# Patient Record
Sex: Female | Born: 1970 | Race: White | Hispanic: No | Marital: Married | Smoking: Never smoker
Health system: Southern US, Community
[De-identification: ages and names within clinical notes are randomized; demographics above are authoritative.]

## PROBLEM LIST (undated history)

## (undated) DIAGNOSIS — N809 Endometriosis, unspecified: Secondary | ICD-10-CM

## (undated) DIAGNOSIS — G4700 Insomnia, unspecified: Secondary | ICD-10-CM

## (undated) DIAGNOSIS — I83813 Varicose veins of bilateral lower extremities with pain: Secondary | ICD-10-CM

## (undated) DIAGNOSIS — I6522 Occlusion and stenosis of left carotid artery: Secondary | ICD-10-CM

## (undated) DIAGNOSIS — I4510 Unspecified right bundle-branch block: Secondary | ICD-10-CM

## (undated) DIAGNOSIS — J302 Other seasonal allergic rhinitis: Secondary | ICD-10-CM

## (undated) DIAGNOSIS — G510 Bell's palsy: Secondary | ICD-10-CM

## (undated) HISTORY — PX: WISDOM TOOTH EXTRACTION: SHX21

## (undated) HISTORY — DX: Endometriosis, unspecified: N80.9

## (undated) HISTORY — DX: Occlusion and stenosis of left carotid artery: I65.22

## (undated) HISTORY — DX: Bell's palsy: G51.0

## (undated) HISTORY — DX: Morbid (severe) obesity due to excess calories: E66.01

## (undated) HISTORY — DX: Insomnia, unspecified: G47.00

## (undated) HISTORY — DX: Unspecified right bundle-branch block: I45.10

---

## 2007-12-25 ENCOUNTER — Other Ambulatory Visit: Admission: RE | Admit: 2007-12-25 | Discharge: 2007-12-25 | Payer: Self-pay | Admitting: Family Medicine

## 2011-02-12 ENCOUNTER — Other Ambulatory Visit (HOSPITAL_COMMUNITY)
Admission: RE | Admit: 2011-02-12 | Discharge: 2011-02-12 | Disposition: A | Discharge status: Home / Self Care | Payer: BC Managed Care – PPO | Source: Ambulatory Visit | Attending: Family Medicine | Admitting: Family Medicine

## 2011-02-12 DIAGNOSIS — Z124 Encounter for screening for malignant neoplasm of cervix: Secondary | ICD-10-CM | POA: Insufficient documentation

## 2013-02-19 ENCOUNTER — Other Ambulatory Visit (HOSPITAL_COMMUNITY): Payer: Self-pay | Admitting: Family Medicine

## 2013-02-19 DIAGNOSIS — Z1231 Encounter for screening mammogram for malignant neoplasm of breast: Secondary | ICD-10-CM

## 2013-02-23 ENCOUNTER — Ambulatory Visit (HOSPITAL_COMMUNITY)
Admission: RE | Admit: 2013-02-23 | Discharge: 2013-02-23 | Disposition: A | Discharge status: Home / Self Care | Payer: BC Managed Care – PPO | Source: Ambulatory Visit | Attending: Family Medicine | Admitting: Family Medicine

## 2013-02-23 DIAGNOSIS — Z1231 Encounter for screening mammogram for malignant neoplasm of breast: Secondary | ICD-10-CM

## 2013-05-19 ENCOUNTER — Other Ambulatory Visit: Payer: Self-pay | Admitting: Family Medicine

## 2013-05-19 ENCOUNTER — Other Ambulatory Visit (HOSPITAL_COMMUNITY)
Admission: RE | Admit: 2013-05-19 | Discharge: 2013-05-19 | Disposition: A | Discharge status: Home / Self Care | Payer: BC Managed Care – PPO | Source: Ambulatory Visit | Attending: Family Medicine | Admitting: Family Medicine

## 2013-05-19 DIAGNOSIS — Z1151 Encounter for screening for human papillomavirus (HPV): Secondary | ICD-10-CM | POA: Insufficient documentation

## 2013-05-19 DIAGNOSIS — Z124 Encounter for screening for malignant neoplasm of cervix: Secondary | ICD-10-CM | POA: Insufficient documentation

## 2014-06-01 ENCOUNTER — Other Ambulatory Visit: Payer: Self-pay | Admitting: Obstetrics & Gynecology

## 2014-06-30 ENCOUNTER — Encounter (HOSPITAL_COMMUNITY): Payer: Self-pay | Admitting: *Deleted

## 2014-07-19 NOTE — H&P (Signed)
44yo G0 who presents for operative laparoscopy, left oophorectomy, bilateral salpingectomy and hysteroscopy, HTA ablation due to ovarian cyst and abnormal uterine bleeding. In review, the patient reports AUB with heavy periods with at least 7 days of bleeding, 2-3 days of heavy bleeding using 10-12 tampons per day and sometimes changing her tampon every 2 hours. + passage of clots/discharge. +Dysmenorrhea- improved with motrin. During her menses, she reports fatigue and "feeling drained." Hgb within normal limits- no headache, dizziness or heart palpitations. Work up included an EMB (negative for hyperplasia or malignancy) as well as a TVUS. The US performed on 3/9 showed an anteverted uterus measuring 9.7x5.9x4.7cm with a posterior submucosal fibroid measuring 2.3x2.1x1.9cm. right ovary unremarkable, left ovary with complex ovarian cyst measuring 4x3.7x3.2cm, avascular. Upon discussion of her cyst, she does report significantly more pain on her left than her right during her menses. Reviewed risk and benefit of conservative vs surgical management of cyst and patient has elected to proceed with surgical management.       ROS:  CONSTITUTIONAL:  no Chills. no Fever. no Skin rash.  HEENT:  Blurrred vision no.  CARDIOLOGY:  no Chest pain.  RESPIRATORY:  no Shortness of breath. no Cough.  GASTROENTEROLOGY:  no Appetite change. no Change in bowel movements.  UROLOGY:  no Urinary frequency. no Urinary incontinence. no Urinary urgency.  FEMALE REPRODUCTIVE:  no Breast lumps or discharge. no Breast pain. no Unusual vaginal discharge. no Vaginal irritation. no Vaginal itching.  NEUROLOGY:  no Dizziness. no Headache.         Medical History: Seasonal allergies, Occasional insomnia, obesity       Gyn History:  Sexual activity currently sexually active.  Periods : every month.  LMP 3/23 (about to start her next menses) Birth control Vasectomy.  Last pap smear date 05/2013.  Last mammogram date  02/23/2013.        OB History:  Never been pregnant per patient.        Surgical History: colposcopy for abn pap smear--normal results. 01/07.        Hospitalization/Major Diagnostic Procedure: see surg hx .        Family History: Father: alive 13 yrs, poor circulation, no contact with fatherMother: alive 60 yrs, rare heart defect, autoimmune issuesBrother 1: alive 72 yrs, osteoporosis       Social History:  General:  Tobacco use  cigarettes: Never smoked Tobacco history last updated 05/20/2014 no Smoking.  Alcohol: yes, beer or wine 1 x daily.  Caffeine: yes, coffee 2 x daily.  no Recreational drug use, no.  Diet: Weight Watchers.  Exercise: yes, 1-2 times per week, walks, bikes, eliptical.  Occupation: employed, Scientist, research (life sciences) estate.  Education: [employer].  Marital Status: married, Married.  Children: none, No.  Firearms: no.  Seat belt use: always.        Medications: Taking Ibuprofen 200 MG Tablet 2 tablets as needed, Taking Claritin(Loratadine) 10 MG Tablet 1 tablet Once a day, Taking Sudafed(Pseudoephedrine HCl) 30 MG Tablet 1 tablet as needed every 6 hrs, Taking Benadryl(DiphenhydrAMINE HCl) 25 mg Capsule 1 capsule as needed       Allergies: N.K.D.A.      Objective: performed on 4/14     Vitals: Wt 187, Wt change -5 lb, Ht 65.75, BMI 30.41, Temp 98.0, Pulse sitting 69, BP sitting 124/73.       Examination:  General Examination:  GENERAL APPEARANCE alert, oriented, NAD, pleasant.  SKIN: normal, no rash.  LUNGS: clear to auscultation bilaterally, no wheezes,  rhonchi, rales.  HEART: no murmurs, regular rate and rhythm.  ABDOMEN: no masses palpated, soft and not tender, no rebound, no guarding.  FEMALE GENITOURINARY: No external lesions, Vagina - pink moist mucosa, no lesions or abnormal discharge, cervix - closed, no discharge or lesions. No CMT. No adnexal masses bilaterally. Uterus: nontender and normal size on palpation.  EXTREMITIES: no edema present, no calf  tenderness bilaterally.    A/P: 44yo G0 who presents for operative laparoscopy, left oophorectomy, bilateral salpingectomy and hysteroscopy, HTA ablation due to ovarian cyst and abnormal uterine bleeding. -NPO -LR @ 125cc/hr -CBC, BMP, T&S to be collected -Ancef 2g IV to OR -Risks, benefits and indications reviewed with patient including but not limited to risk of bleeding, infection, injury to surrounding organs ans possible need for further surgery.  Pt aware and wishes to proceed.  Janyth Pupa, DO [phone] (pager) [phone] (office)

## 2014-07-20 ENCOUNTER — Ambulatory Visit (HOSPITAL_COMMUNITY): Payer: BLUE CROSS/BLUE SHIELD | Admitting: Registered Nurse

## 2014-07-20 ENCOUNTER — Encounter (HOSPITAL_COMMUNITY)
Admission: RE | Disposition: A | Discharge status: Home / Self Care | Payer: Self-pay | Source: Ambulatory Visit | Attending: Obstetrics & Gynecology

## 2014-07-20 ENCOUNTER — Encounter (HOSPITAL_COMMUNITY): Payer: Self-pay

## 2014-07-20 ENCOUNTER — Ambulatory Visit (HOSPITAL_COMMUNITY)
Admission: RE | Admit: 2014-07-20 | Discharge: 2014-07-20 | Disposition: A | Discharge status: Home / Self Care | Payer: BLUE CROSS/BLUE SHIELD | Source: Ambulatory Visit | Attending: Obstetrics & Gynecology | Admitting: Obstetrics & Gynecology

## 2014-07-20 DIAGNOSIS — N830 Follicular cyst of ovary: Secondary | ICD-10-CM | POA: Insufficient documentation

## 2014-07-20 DIAGNOSIS — N802 Endometriosis of fallopian tube: Secondary | ICD-10-CM | POA: Diagnosis not present

## 2014-07-20 DIAGNOSIS — N8320 Unspecified ovarian cysts: Secondary | ICD-10-CM | POA: Diagnosis present

## 2014-07-20 DIAGNOSIS — N736 Female pelvic peritoneal adhesions (postinfective): Secondary | ICD-10-CM | POA: Diagnosis not present

## 2014-07-20 DIAGNOSIS — N939 Abnormal uterine and vaginal bleeding, unspecified: Secondary | ICD-10-CM | POA: Insufficient documentation

## 2014-07-20 DIAGNOSIS — N801 Endometriosis of ovary: Secondary | ICD-10-CM | POA: Diagnosis not present

## 2014-07-20 DIAGNOSIS — N803 Endometriosis of pelvic peritoneum: Secondary | ICD-10-CM | POA: Insufficient documentation

## 2014-07-20 HISTORY — PX: HYSTEROSCOPY: SHX211

## 2014-07-20 HISTORY — PX: OOPHORECTOMY: SHX6387

## 2014-07-20 HISTORY — PX: DILATION AND CURETTAGE OF UTERUS: SHX78

## 2014-07-20 HISTORY — PX: BILATERAL SALPINGECTOMY: SHX5743

## 2014-07-20 HISTORY — PX: LAPAROSCOPY: SHX197

## 2014-07-20 LAB — CBC
HCT: 37.6 % (ref 36.0–46.0)
HEMOGLOBIN: 12.8 g/dL (ref 12.0–15.0)
MCH: 32 pg (ref 26.0–34.0)
MCHC: 34 g/dL (ref 30.0–36.0)
MCV: 94 fL (ref 78.0–100.0)
Platelets: 197 10*3/uL (ref 150–400)
RBC: 4 MIL/uL (ref 3.87–5.11)
RDW: 13.5 % (ref 11.5–15.5)
WBC: 6.2 10*3/uL (ref 4.0–10.5)

## 2014-07-20 LAB — TYPE AND SCREEN
ABO/RH(D): A POS
Antibody Screen: NEGATIVE

## 2014-07-20 LAB — ABO/RH: ABO/RH(D): A POS

## 2014-07-20 LAB — BASIC METABOLIC PANEL
Anion gap: 6 (ref 5–15)
BUN: 15 mg/dL (ref 6–23)
CHLORIDE: 106 mmol/L (ref 96–112)
CO2: 26 mmol/L (ref 19–32)
CREATININE: 0.74 mg/dL (ref 0.50–1.10)
Calcium: 8.9 mg/dL (ref 8.4–10.5)
GFR calc Af Amer: >90 mL/min (ref >90)
GFR calc non Af Amer: >90 mL/min (ref >90)
Glucose, Bld: 90 mg/dL (ref 70–99)
Potassium: 4 mmol/L (ref 3.5–5.1)
Sodium: 138 mmol/L (ref 135–145)

## 2014-07-20 SURGERY — LAPAROSCOPY OPERATIVE
Anesthesia: General | Site: Vagina

## 2014-07-20 MED ORDER — NEOSTIGMINE METHYLSULFATE 10 MG/10ML IV SOLN
INTRAVENOUS | Status: DC | PRN
  Administered 2014-07-20: 4 mg via INTRAVENOUS

## 2014-07-20 MED ORDER — GLYCOPYRROLATE 0.2 MG/ML IJ SOLN
INTRAMUSCULAR | Status: AC
  Filled 2014-07-20: qty 1

## 2014-07-20 MED ORDER — LACTATED RINGERS IV SOLN: INTRAVENOUS | Status: DC

## 2014-07-20 MED ORDER — DEXAMETHASONE SODIUM PHOSPHATE 4 MG/ML IJ SOLN
INTRAMUSCULAR | Status: DC | PRN
  Administered 2014-07-20: 4 mg via INTRAVENOUS

## 2014-07-20 MED ORDER — ONDANSETRON HCL 4 MG/2ML IJ SOLN: 4.0000 mg | Freq: Once | INTRAMUSCULAR | Status: DC | PRN

## 2014-07-20 MED ORDER — METOCLOPRAMIDE HCL 5 MG/ML IJ SOLN
INTRAMUSCULAR | Status: AC
  Administered 2014-07-20: 10 mg via INTRAVENOUS
  Filled 2014-07-20: qty 2

## 2014-07-20 MED ORDER — FENTANYL CITRATE (PF) 100 MCG/2ML IJ SOLN
INTRAMUSCULAR | Status: DC | PRN
  Administered 2014-07-20: 25 ug via INTRAVENOUS
  Administered 2014-07-20: 50 ug via INTRAVENOUS
  Administered 2014-07-20 (×2): 25 ug via INTRAVENOUS
  Administered 2014-07-20: 50 ug via INTRAVENOUS
  Administered 2014-07-20: 150 ug via INTRAVENOUS
  Administered 2014-07-20: 25 ug via INTRAVENOUS

## 2014-07-20 MED ORDER — CEFAZOLIN SODIUM-DEXTROSE 2-3 GM-% IV SOLR
2.0000 g | INTRAVENOUS | Status: AC
  Administered 2014-07-20: 2 g via INTRAVENOUS

## 2014-07-20 MED ORDER — DOCUSATE SODIUM 100 MG PO CAPS: 100.0000 mg | ORAL_CAPSULE | Freq: Two times a day (BID) | ORAL | Status: DC | PRN

## 2014-07-20 MED ORDER — PROPOFOL 10 MG/ML IV BOLUS
INTRAVENOUS | Status: DC | PRN
  Administered 2014-07-20: 200 mg via INTRAVENOUS

## 2014-07-20 MED ORDER — SCOPOLAMINE 1 MG/3DAYS TD PT72
1.0000 | MEDICATED_PATCH | Freq: Once | TRANSDERMAL | Status: DC
  Administered 2014-07-20: 1.5 mg via TRANSDERMAL

## 2014-07-20 MED ORDER — ONDANSETRON HCL 4 MG/2ML IJ SOLN
INTRAMUSCULAR | Status: DC | PRN
  Administered 2014-07-20: 4 mg via INTRAVENOUS

## 2014-07-20 MED ORDER — BUPIVACAINE HCL (PF) 0.25 % IJ SOLN
INTRAMUSCULAR | Status: DC | PRN
  Administered 2014-07-20: 28 mL

## 2014-07-20 MED ORDER — ROCURONIUM BROMIDE 100 MG/10ML IV SOLN
INTRAVENOUS | Status: DC | PRN
  Administered 2014-07-20: 40 mg via INTRAVENOUS

## 2014-07-20 MED ORDER — MIDAZOLAM HCL 2 MG/2ML IJ SOLN
INTRAMUSCULAR | Status: AC
  Filled 2014-07-20: qty 2

## 2014-07-20 MED ORDER — HYDROMORPHONE HCL 1 MG/ML IJ SOLN
0.2500 mg | INTRAMUSCULAR | Status: DC | PRN
  Administered 2014-07-20 (×2): 0.25 mg via INTRAVENOUS

## 2014-07-20 MED ORDER — SCOPOLAMINE 1 MG/3DAYS TD PT72
MEDICATED_PATCH | TRANSDERMAL | Status: AC
  Administered 2014-07-20: 1.5 mg via TRANSDERMAL
  Filled 2014-07-20: qty 1

## 2014-07-20 MED ORDER — GLYCOPYRROLATE 0.2 MG/ML IJ SOLN
INTRAMUSCULAR | Status: DC | PRN
  Administered 2014-07-20: 0.6 mg via INTRAVENOUS

## 2014-07-20 MED ORDER — ONDANSETRON HCL 4 MG/2ML IJ SOLN
INTRAMUSCULAR | Status: AC
  Filled 2014-07-20: qty 2

## 2014-07-20 MED ORDER — ROCURONIUM BROMIDE 100 MG/10ML IV SOLN
INTRAVENOUS | Status: AC
  Filled 2014-07-20: qty 1

## 2014-07-20 MED ORDER — OXYCODONE-ACETAMINOPHEN 5-325 MG PO TABS
1.0000 | ORAL_TABLET | ORAL | Status: DC | PRN
  Administered 2014-07-20: 1 via ORAL

## 2014-07-20 MED ORDER — NEOSTIGMINE METHYLSULFATE 10 MG/10ML IV SOLN
INTRAVENOUS | Status: AC
  Filled 2014-07-20: qty 1

## 2014-07-20 MED ORDER — DEXAMETHASONE SODIUM PHOSPHATE 4 MG/ML IJ SOLN
INTRAMUSCULAR | Status: AC
  Filled 2014-07-20: qty 1

## 2014-07-20 MED ORDER — KETOROLAC TROMETHAMINE 30 MG/ML IJ SOLN
INTRAMUSCULAR | Status: DC | PRN
  Administered 2014-07-20: 30 mg via INTRAVENOUS

## 2014-07-20 MED ORDER — METOCLOPRAMIDE HCL 5 MG/ML IJ SOLN
10.0000 mg | Freq: Once | INTRAMUSCULAR | Status: AC
  Administered 2014-07-20: 10 mg via INTRAVENOUS

## 2014-07-20 MED ORDER — HYDROMORPHONE HCL 1 MG/ML IJ SOLN
INTRAMUSCULAR | Status: AC
  Administered 2014-07-20: 0.25 mg via INTRAVENOUS
  Filled 2014-07-20: qty 1

## 2014-07-20 MED ORDER — LIDOCAINE HCL (CARDIAC) 20 MG/ML IV SOLN
INTRAVENOUS | Status: AC
  Filled 2014-07-20: qty 5

## 2014-07-20 MED ORDER — MIDAZOLAM HCL 5 MG/5ML IJ SOLN
INTRAMUSCULAR | Status: DC | PRN
  Administered 2014-07-20: 2 mg via INTRAVENOUS

## 2014-07-20 MED ORDER — LIDOCAINE HCL (CARDIAC) 20 MG/ML IV SOLN
INTRAVENOUS | Status: DC | PRN
  Administered 2014-07-20: 50 mg via INTRAVENOUS

## 2014-07-20 MED ORDER — LACTATED RINGERS IV SOLN
INTRAVENOUS | Status: DC
  Administered 2014-07-20 (×3): via INTRAVENOUS

## 2014-07-20 MED ORDER — OXYCODONE-ACETAMINOPHEN 5-325 MG PO TABS
ORAL_TABLET | ORAL | Status: AC
  Filled 2014-07-20: qty 1

## 2014-07-20 MED ORDER — FENTANYL CITRATE (PF) 100 MCG/2ML IJ SOLN
INTRAMUSCULAR | Status: AC
  Filled 2014-07-20: qty 2

## 2014-07-20 MED ORDER — PROPOFOL 10 MG/ML IV BOLUS
INTRAVENOUS | Status: AC
  Filled 2014-07-20: qty 20

## 2014-07-20 MED ORDER — BUPIVACAINE HCL (PF) 0.25 % IJ SOLN
INTRAMUSCULAR | Status: AC
  Filled 2014-07-20: qty 30

## 2014-07-20 MED ORDER — KETOROLAC TROMETHAMINE 30 MG/ML IJ SOLN
INTRAMUSCULAR | Status: AC
  Filled 2014-07-20: qty 1

## 2014-07-20 MED ORDER — CEFAZOLIN SODIUM-DEXTROSE 2-3 GM-% IV SOLR
INTRAVENOUS | Status: AC
  Filled 2014-07-20: qty 50

## 2014-07-20 MED ORDER — OXYCODONE-ACETAMINOPHEN 5-325 MG PO TABS: 1.0000 | ORAL_TABLET | Freq: Four times a day (QID) | ORAL | Status: DC | PRN

## 2014-07-20 MED ORDER — IBUPROFEN 600 MG PO TABS: 600.0000 mg | ORAL_TABLET | Freq: Four times a day (QID) | ORAL | Status: DC | PRN

## 2014-07-20 MED ORDER — KETOROLAC TROMETHAMINE 30 MG/ML IJ SOLN: 30.0000 mg | Freq: Once | INTRAMUSCULAR | Status: DC | PRN

## 2014-07-20 MED ORDER — FENTANYL CITRATE (PF) 250 MCG/5ML IJ SOLN
INTRAMUSCULAR | Status: AC
  Filled 2014-07-20: qty 5

## 2014-07-20 MED ORDER — SODIUM CHLORIDE 0.9 % IR SOLN
Status: DC | PRN
  Administered 2014-07-20 (×2): 3000 mL

## 2014-07-20 MED ORDER — MEPERIDINE HCL 25 MG/ML IJ SOLN: 6.2500 mg | INTRAMUSCULAR | Status: DC | PRN

## 2014-07-20 SURGICAL SUPPLY — 33 items
CANISTER SUCT 3000ML (MISCELLANEOUS) ×6 IMPLANT
CLOTH BEACON ORANGE TIMEOUT ST (SAFETY) ×6 IMPLANT
CONTAINER PREFILL 10% NBF 60ML (FORM) ×12 IMPLANT
DRSG COVADERM PLUS 2X2 (GAUZE/BANDAGES/DRESSINGS) ×12 IMPLANT
DRSG OPSITE POSTOP 3X4 (GAUZE/BANDAGES/DRESSINGS) ×6 IMPLANT
DRSG OPSITE POSTOP 4X10 (GAUZE/BANDAGES/DRESSINGS) ×6 IMPLANT
DURAPREP 26ML APPLICATOR (WOUND CARE) ×6 IMPLANT
GLOVE BIOGEL PI IND STRL 6.5 (GLOVE) ×10 IMPLANT
GLOVE BIOGEL PI INDICATOR 6.5 (GLOVE) ×2
GLOVE ECLIPSE 6.5 STRL STRAW (GLOVE) ×6 IMPLANT
GOWN STRL REUS W/TWL LRG LVL3 (GOWN DISPOSABLE) ×12 IMPLANT
LIQUID BAND (GAUZE/BANDAGES/DRESSINGS) ×6 IMPLANT
NEEDLE INSUFFLATION 120MM (ENDOMECHANICALS) ×6 IMPLANT
PACK LAPAROSCOPY BASIN (CUSTOM PROCEDURE TRAY) ×6 IMPLANT
PACK VAGINAL MINOR WOMEN LF (CUSTOM PROCEDURE TRAY) ×6 IMPLANT
PAD OB MATERNITY 4.3X12.25 (PERSONAL CARE ITEMS) ×6 IMPLANT
PAD POSITIONER PINK NONSTERILE (MISCELLANEOUS) ×6 IMPLANT
POUCH SPECIMEN RETRIEVAL 10MM (ENDOMECHANICALS) ×6 IMPLANT
SET GENESYS HTA PROCERVA (MISCELLANEOUS) ×6 IMPLANT
SET IRRIG TUBING LAPAROSCOPIC (IRRIGATION / IRRIGATOR) ×6 IMPLANT
SHEARS HARMONIC ACE PLUS 36CM (ENDOMECHANICALS) ×6 IMPLANT
SLEEVE XCEL OPT CAN 5 100 (ENDOMECHANICALS) ×6 IMPLANT
STRIP CLOSURE SKIN 1/4X4 (GAUZE/BANDAGES/DRESSINGS) IMPLANT
SUT MON AB 4-0 PS1 27 (SUTURE) ×6 IMPLANT
SUT VIC AB 0 CT1 27 (SUTURE) ×1
SUT VIC AB 0 CT1 27XBRD ANBCTR (SUTURE) ×5 IMPLANT
SUT VICRYL 0 UR6 27IN ABS (SUTURE) ×18 IMPLANT
TOWEL OR 17X24 6PK STRL BLUE (TOWEL DISPOSABLE) ×12 IMPLANT
TRAY FOLEY CATH SILVER 14FR (SET/KITS/TRAYS/PACK) ×6 IMPLANT
TROCAR XCEL NON-BLD 11X100MML (ENDOMECHANICALS) ×6 IMPLANT
TROCAR XCEL NON-BLD 5MMX100MML (ENDOMECHANICALS) ×6 IMPLANT
WARMER LAPAROSCOPE (MISCELLANEOUS) ×6 IMPLANT
WATER STERILE IRR 1000ML POUR (IV SOLUTION) ×6 IMPLANT

## 2014-07-20 NOTE — Anesthesia Preprocedure Evaluation (Signed)
Anesthesia Evaluation  Patient identified by MRN, date of birth, ID band Patient awake    Reviewed: Allergy & Precautions, H&P , NPO status , Patient's Chart, lab work & pertinent test results  Airway Mallampati: II  TM Distance: >3 FB Neck ROM: full    Dental no notable dental hx. (+) Teeth Intact   Pulmonary neg pulmonary ROS,    Pulmonary exam normal       Cardiovascular negative cardio ROS      Neuro/Psych negative neurological ROS  negative psych ROS   GI/Hepatic negative GI ROS, Neg liver ROS,   Endo/Other  negative endocrine ROS  Renal/GU negative Renal ROS     Musculoskeletal   Abdominal Normal abdominal exam  (+)   Peds  Hematology negative hematology ROS (+)   Anesthesia Other Findings   Reproductive/Obstetrics negative OB ROS                             Anesthesia Physical Anesthesia Plan  ASA: I  Anesthesia Plan: General   Post-op Pain Management:    Induction: Intravenous  Airway Management Planned: Oral ETT  Additional Equipment:   Intra-op Plan:   Post-operative Plan: Extubation in OR  Informed Consent: I have reviewed the patients History and Physical, chart, labs and discussed the procedure including the risks, benefits and alternatives for the proposed anesthesia with the patient or authorized representative who has indicated his/her understanding and acceptance.   Dental Advisory Given  Plan Discussed with: CRNA  Anesthesia Plan Comments:         Anesthesia Quick Evaluation

## 2014-07-20 NOTE — Anesthesia Procedure Notes (Signed)
Procedure Name: Intubation Date/Time: 07/20/2014 11:34 AM Performed by: Talbot Grumbling Pre-anesthesia Checklist: Patient identified, Emergency Drugs available, Suction available and Patient being monitored Patient Re-evaluated:Patient Re-evaluated prior to inductionOxygen Delivery Method: Circle system utilized Preoxygenation: Pre-oxygenation with 100% oxygen Intubation Type: IV induction Ventilation: Mask ventilation without difficulty Laryngoscope Size: Miller and 2 Grade View: Grade I Tube type: Oral Tube size: 7.0 mm Number of attempts: 1 Airway Equipment and Method: Stylet Placement Confirmation: ETT inserted through vocal cords under direct vision,  positive ETCO2 and breath sounds checked- equal and bilateral Secured at: 21 cm Tube secured with: Tape Dental Injury: Teeth and Oropharynx as per pre-operative assessment

## 2014-07-20 NOTE — Op Note (Signed)
PREOPERATIVE DIAGNOSIS:  Complex ovarian cyst, abnormal uterine bleeding POSTOPERATIVE DIAGNOSIS: same and Pelvic adhesions, concern for endometriosis PROCEDURE PERFORMED: Laparoscopic left oophorectomy, bilateral salpingectomy, lysis of adhesion, hysteroscopy, D&C, HTA ablation SURGEON: Dr. Janyth Pupa ASSISTANT:Dr. Cyndia Skeeters ANESTHESIA: General endotracheal.  ESTIMATED BLOOD LOSS: 25cc.  URINE OUTPUT: 75cc of clear yellow urine at the end of the procedure.  IV FLUIDS: 1000 cc of crystalloid.  SPECIMEN(S): 1) Bilateral fallopian tubes 2) left ovary 3) right ovarian biopsy 4) right utereosacral ligament biopsy 5) pelvic washings COMPLICATIONS: None.  CONDITION: Stable.  FINDINGS: No ascites or peritoneal studding was appreciated.  Liver, gallbladder and bowel appeared grossly normal except adhesions of rectosigmoid to left pelvic side wall.  Also, all tissue appears to be hypervascular.  Appendix visualized, unremarkable.  Uterus normal size and shape except for a 69mm nodule located at the junction of the lower uterine segment and right uterosacral ligament.  Left ovary enlarged with 4cm cyst making the ovary appear bi-lobed.  Right ovary with thin mucus-like substance noted on the surface of the ovary.  1cm paratubal right ovarian cyst seen.  Both fallopian tubes appeared unremarkable.  No other nodules or lesions visualized.  Thin adhesion noted between left adnexa and pelvic sidewall.    Informed consent was obtained from the patient prior to taking her to the operating room where anesthesia was found to be adequate. She was placed in dorsal lithotomy position.  She was prepped and draped in normal sterile fashion. The bladder was catheterized with a foley under sterile technique. A bi-valve speculum was then placed and the anterior lip of the cervix was grasped with the single tooth tenaculum. The uterus was sounded to 7cm and a [place] uterine manipulator was then advanced into the uterus to  provide uterine mobility. The speculum and tenaculum were then removed.  Attention was turned to the patien'ts abdomen where a 10 mm infraumbilical skin incision was made with the scalpel. The veress needle was carefully introduced into the peritoneal cavity while tenting the abdominal wall. Intraperitoneal placement was confirmed by use of a saline-drop test.  The gas was connected and confirmed intrabdominal placement by a low initial pressure of 7 mmHg. The abdomen was then insuflated with CO2 gas. The trocar and sleeve were then advanced without difficulty into the abdomen under direct visualization. Intraabdominal placement was confirmed by the laparoscope and surveillance of the abdomen was performed. The abdomen was examined, findings as described above.  Two additional 85mm skin incision were made in the left and right lower quadrants with placement of the trocar under direct visualization.  Pelvic washings were obtained.  The right ureter was identified.  The right fallopian tube was grasped and using the Harmonic, the right fallopian tube was removed with serial ligations.  Attention was turned to the left adnexa.  Thin pelvic sidewall adhesions were taken down with the Harmonic.  The left ureter was visualized though difficult to the bowel adhesions to the pelvic side wall.  In a similar fashion, the left fallopian tube was grasped and removed using the Harmonic.  The left ovary was grasped and serial ligation was performed using the Harmonic.  The biopsy specimens were obtained (right ovary and right uterosacral ligament) due to concern for endometriosis.  Copious irrigation was performed and excellent hemostasis was noted.  The endocatch bag was then placed through the 11 mm port and the specimens were removed in their entirety. Re-examination of the uterus and abdominal cavity confirmed hemostasis. The fascial closure device  was used to close the umbilical incision under direct visualization using  0-vicryl. The remaining trocars were removed with air allowed to fully escape. The umbilical port sites was closed with monocryl and all skin incisions were covered with dermabond.    Attention was turned to vaginal portion of the exam. A sterile speculum was inserted into the vagina.  The manipulator was removed and a single tooth tenaculum was placed on the anterior lip of the cervix. The uterus was then sounded to 14French. The HTA system was inserted, diagnostic hysteroscopy was performed.  A large polyp was seen centrally within the uterine cavity obstructing a clear view of the full cavity.  Behind the polyp the fundus was visualized. The hydrothermal ablation was performed according to manufacturer's guidelines and activated for 80min.  The hysteroscope was removed. Curettage was performed and curettings were sent to pathology.  Global ablation was visualized and no uterine perforation was seen. All instrument were removed. Hemostasis was obtained at the cervical site using silver nitrate. The patient was repositioned to the supine position. The patient tolerated the procedure well with all sponge, lap, and needle counts correct. The patient was taken to recovery in stable condition.   Dr. Simona Huh was present for the laparoscopic portion of the case to assist as residents are not available.  She assisted in her side of the procedure and visualization.  Janyth Pupa, DO [phone] (pager) [phone] (office)

## 2014-07-20 NOTE — Anesthesia Postprocedure Evaluation (Signed)
  Anesthesia Post-op Note  Patient: Ellen Galloway  Procedure(s) Performed: Procedure(s): LAPAROSCOPY OPERATIVE (N/A) HYSTEROSCOPY WITH HYDROTHERMAL ABLATION (N/A) DILATATION AND CURETTAGE (N/A) BILATERAL SALPINGECTOMY (N/A) OOPHORECTOMY (Left)  Patient Location: PACU  Anesthesia Type:General  Level of Consciousness: awake, alert  and oriented  Airway and Oxygen Therapy: Patient Spontanous Breathing  Post-op Pain: mild  Post-op Assessment: Post-op Vital signs reviewed, Patient's Cardiovascular Status Stable, Respiratory Function Stable and Patent Airway  Post-op Vital Signs: Reviewed and stable  Last Vitals:  Filed Vitals:   07/20/14 1445  BP: 105/51  Pulse: 54  Temp:   Resp: 18    Complications: No apparent anesthesia complications

## 2014-07-20 NOTE — Interval H&P Note (Signed)
History and Physical Interval Note:  07/20/2014 11:16 AM  Ellen Galloway  has presented today for surgery, with the diagnosis of Abnormal Uterine Bleeding  The various methods of treatment have been discussed with the patient and family. After consideration of risks, benefits and other options for treatment, the patient has consented to  Procedure(s): DILATATION & CURETTAGE/HYSTEROSCOPY WITH HYDROTHERMAL ABLATION (N/A) LAPAROSCOPY OPERATIVE (N/A) BILATERAL salpingectomy, left oophorectomy as a surgical intervention .  The patient's history has been reviewed, patient examined, no change in status, stable for surgery.  I have reviewed the patient's chart and labs.  Questions were answered to the patient's satisfaction.     Janyth Pupa, M

## 2014-07-20 NOTE — Discharge Instructions (Addendum)
HOME INSTRUCTIONS  May remove Scop patch on or before July 22, 2014.  May take Ibuprofen after 7:09 pm as needed for pain.  Please note any unusual or excessive bleeding, pain, swelling. Mild dizziness or drowsiness are normal for about 24 hours after surgery.   Personal hygiene:  Shower the day after your procedure.   Restrictions: No driving for 24 hours or while taking pain medications.  Activity and limitations:  Do NOT drive or operate any equipment today.  No heavy lifting (> 15 lbs), nothing in vagina (no tampons, douching, or intercourse) x 2 weeks; no tub baths for 2 weeks Vaginal spotting is expected but if your bleeding is heavy, period like,  please call the office  Do NOT rest in bed all day.  Walking is encouraged. Walk each day, starting slowly with 5-minute walks 3 or 4 times a day. Slowly increase the length of your walks.  Walk up and down stairs slowly.  Do NOT do strenuous activities, such as golfing, playing tennis, bowling, running, biking, weight lifting, gardening, mowing, or vacuuming for 2-4 weeks. Ask your doctor when it is okay to start.   Incision: the honeycomb dressing may be removed in the next 1- 2 days.  There is also dermabond (adhesive bandaid) on all of the incisions.  It will fall off when they are ready to; you may clean your incision with mild soap and water but do not rub or scrub the incision site.  You may experience slight bloody drainage from your incision periodically.  This is normal.  If you experience a large amount of drainage or the incision opens, should the incision become sore, red, and swollen after the first week, check with your doctor.  What to expect after your surgery: You may have a slight burning sensation when you urinate on the first day. You may have a very small amount of blood in the urine. Expect to have a small amount of vaginal discharge/light bleeding for 1-2 weeks. It is not unusual to have abdominal soreness and  bruising for up to 2 weeks. You may be tired and need more rest for about 1 week. You may experience shoulder pain for 24-72 hours. Lying flat in bed may relieve it.   Diet:  Eat a light meal as desired this evening. You may resume your usual diet tomorrow.  Do not eat large meals.  Eat small frequent meals throughout the day.  Continue to drink a good amount of water at least 6-8 glasses of water per day, hydration is very important for the healing process.  Pain Management: Take Motrin and/or Percocet as prescribed/needed for pain. Always take prescription pain medication with food, it may cause constipation, increase fluids and fiber and you may want to take an over-the-counter stool softener like Colace as needed up to 2x a day.    Alcohol -- Avoid for 24 hours and while taking pain medications.  Nausea: Take sips of ginger ale or soda  Fever -- Call physician if temperature over 101 degrees  Follow up:  If you do not already have a follow up appointment scheduled, please call the office at [phone].  If you experience fever (a temperature greater than 101), pain unrelieved by pain medication, shortness of breath, swelling of a single leg,   Inability to urinate 6 hours after discharge from hospital  Severe pain not relieved by pain medications  Persistent of heavy bleeding at incision site  Redness or swelling around incision site after  a week  Increasing nausea or vomiting or any other symptoms which are concerning to you please the office immediately.

## 2014-07-20 NOTE — Transfer of Care (Signed)
Immediate Anesthesia Transfer of Care Note  Patient: Jeffie Pollock  Procedure(s) Performed: Procedure(s): LAPAROSCOPY OPERATIVE (N/A) HYSTEROSCOPY WITH HYDROTHERMAL ABLATION (N/A) DILATATION AND CURETTAGE (N/A) BILATERAL SALPINGECTOMY (N/A) OOPHORECTOMY (Left)  Patient Location: PACU  Anesthesia Type:General  Level of Consciousness: awake, alert  and oriented  Airway & Oxygen Therapy: Patient Spontanous Breathing and Patient connected to nasal cannula oxygen  Post-op Assessment: Report given to RN and Post -op Vital signs reviewed and stable  Post vital signs: Reviewed and stable  Last Vitals:  Filed Vitals:   07/20/14 1024  BP: 116/61  Pulse: 56  Temp: 36.4 C  Resp: 20    Complications: No apparent anesthesia complications

## 2014-07-21 ENCOUNTER — Encounter (HOSPITAL_COMMUNITY): Payer: Self-pay | Admitting: Obstetrics & Gynecology

## 2016-04-19 DIAGNOSIS — J0110 Acute frontal sinusitis, unspecified: Secondary | ICD-10-CM | POA: Diagnosis not present

## 2016-04-26 DIAGNOSIS — M79644 Pain in right finger(s): Secondary | ICD-10-CM | POA: Diagnosis not present

## 2016-06-07 ENCOUNTER — Other Ambulatory Visit: Payer: Self-pay | Admitting: Family Medicine

## 2016-06-07 ENCOUNTER — Other Ambulatory Visit (HOSPITAL_COMMUNITY)
Admission: RE | Admit: 2016-06-07 | Discharge: 2016-06-07 | Disposition: A | Discharge status: Home / Self Care | Payer: BLUE CROSS/BLUE SHIELD | Source: Ambulatory Visit | Attending: Family Medicine | Admitting: Family Medicine

## 2016-06-07 DIAGNOSIS — Z01411 Encounter for gynecological examination (general) (routine) with abnormal findings: Secondary | ICD-10-CM | POA: Diagnosis not present

## 2016-06-07 DIAGNOSIS — Z1151 Encounter for screening for human papillomavirus (HPV): Secondary | ICD-10-CM | POA: Diagnosis not present

## 2016-06-07 DIAGNOSIS — Z0000 Encounter for general adult medical examination without abnormal findings: Secondary | ICD-10-CM | POA: Diagnosis not present

## 2016-06-07 DIAGNOSIS — Z13220 Encounter for screening for lipoid disorders: Secondary | ICD-10-CM | POA: Diagnosis not present

## 2016-06-07 DIAGNOSIS — Z5181 Encounter for therapeutic drug level monitoring: Secondary | ICD-10-CM | POA: Diagnosis not present

## 2016-06-07 DIAGNOSIS — E611 Iron deficiency: Secondary | ICD-10-CM | POA: Diagnosis not present

## 2016-06-10 LAB — CYTOLOGY - PAP
DIAGNOSIS: NEGATIVE
HPV: NOT DETECTED

## 2016-06-12 DIAGNOSIS — Z1231 Encounter for screening mammogram for malignant neoplasm of breast: Secondary | ICD-10-CM | POA: Diagnosis not present

## 2017-05-07 DIAGNOSIS — N949 Unspecified condition associated with female genital organs and menstrual cycle: Secondary | ICD-10-CM | POA: Diagnosis not present

## 2017-05-07 DIAGNOSIS — N809 Endometriosis, unspecified: Secondary | ICD-10-CM | POA: Diagnosis not present

## 2017-05-07 DIAGNOSIS — R102 Pelvic and perineal pain: Secondary | ICD-10-CM | POA: Diagnosis not present

## 2017-05-29 DIAGNOSIS — R102 Pelvic and perineal pain: Secondary | ICD-10-CM | POA: Diagnosis not present

## 2017-05-29 DIAGNOSIS — N809 Endometriosis, unspecified: Secondary | ICD-10-CM | POA: Diagnosis not present

## 2017-05-29 DIAGNOSIS — N949 Unspecified condition associated with female genital organs and menstrual cycle: Secondary | ICD-10-CM | POA: Diagnosis not present

## 2017-06-20 DIAGNOSIS — Z23 Encounter for immunization: Secondary | ICD-10-CM | POA: Diagnosis not present

## 2017-06-20 DIAGNOSIS — Z0000 Encounter for general adult medical examination without abnormal findings: Secondary | ICD-10-CM | POA: Diagnosis not present

## 2017-06-20 DIAGNOSIS — Z13220 Encounter for screening for lipoid disorders: Secondary | ICD-10-CM | POA: Diagnosis not present

## 2017-06-24 DIAGNOSIS — Z1231 Encounter for screening mammogram for malignant neoplasm of breast: Secondary | ICD-10-CM | POA: Diagnosis not present

## 2017-07-09 DIAGNOSIS — Z01818 Encounter for other preprocedural examination: Secondary | ICD-10-CM | POA: Diagnosis not present

## 2017-07-09 DIAGNOSIS — N939 Abnormal uterine and vaginal bleeding, unspecified: Secondary | ICD-10-CM | POA: Diagnosis not present

## 2017-07-09 DIAGNOSIS — R102 Pelvic and perineal pain: Secondary | ICD-10-CM | POA: Diagnosis not present

## 2017-07-09 DIAGNOSIS — N809 Endometriosis, unspecified: Secondary | ICD-10-CM | POA: Diagnosis not present

## 2017-07-13 NOTE — H&P (Signed)
47yo G0 who presents for TLH, right oophorectomy due to AUB, pelvic and endometriosis. She reports that the OCPs do seem to help with the heaviness of the bleeding, but she is still "cycling" in the background. In review, she was started on continous OCPs to help control her abnormal bleeding; however, she is having breakthrough bleeding. She can feel that her body is "trying to", but isn't. She is still having intermittent pelvic pain- some improvement s/p aleve. Despite some improvement, she wishes to proceed with a more permanent solution and wishes to proceed with surgical intervention. She was diagnosed by biopsy confirmed procedure (07/2014)- underwent [place] left oophorectomy, bilateral salpingectomy, LOA and HTA ablation.     [place] (05/29/2017): 9cm anteverted uterus- fibroids decreased in size- largest fibroid 2cm. Endometrium wnl. Right ovary wnl, left ovary absent. Large amount of fluid-filled mobile bowel loops noted throughout pelvis.      ROS:  CONSTITUTIONAL:  no Chills. no Fever. no Night sweats.  HEENT:  Blurrred vision no. no Double vision.  CARDIOLOGY:  no Chest pain.  RESPIRATORY:  no Shortness of breath. no Cough.  UROLOGY:  no Urinary frequency. no Urinary incontinence. no Urinary urgency.  GASTROENTEROLOGY:  Abdominal pain yes. no Appetite change. Bloating/belching yes. Change in bowel habits yes. no Change in bowel movements.  FEMALE REPRODUCTIVE:  See HPI for details. no Breast lumps or discharge. no Breast pain.  NEUROLOGY:  no Dizziness. no Headache. no Loss of consciousness.  PSYCHOLOGY:  no Anxiety. no Depression.  SKIN:  no Rash. no Hives.  HEMATOLOGY/LYMPH:  no Anemia. no Fatigue. Using Blood Thinners no.         Medical History: Seasonal allergies, Occasional insomnia, morbid obesity which has been successfully treated in the past with weight watchers. Patient was maintained in the 140s for quite some time. She is still a member of [employer]  Endometriosis.         Gyn History:  Sexual activity currently sexually active.  Periods : every month.  Birth control Vasectomy/Norethindrone.  Last pap smear date 05/2016.  Last mammogram date 06/12/2016.  GYN procedures s/p endometrial ablation.        OB History:  Never been pregnant per patient.        Surgical History: colposcopy for abn pap smear--normal results. 01/07, Lapraoscopy- LSO and HTA Ablation-Monna Crean 07/2014.        Hospitalization/Major Diagnostic Procedure: see surg hx .        Family History: Father: deceased 37 yrs, poor circulation, no contact with father. Mother: alive 48 yrs, rare heart defect, autoimmune issues. Maternal Grand Mother: deceased 8 yrs, diagnosed with Colon cancer. Brother 1: alive 2 yrs, osteoporosis.  postive family hx for colon cancer maternal grand mother.       Social History:  General:  Tobacco use  cigarettes: Never smoked Tobacco history last updated 07/09/2017 no EXPOSURE TO PASSIVE SMOKE.  Alcohol: yes, 1-2 per week beer .  Caffeine: yes, coffee 2 x daily.  no Recreational drug use, no.  DIET: Weight Watchers.  Exercise: yes, 3-4 times per week, walks, bikes, eliptical, yoga .  Marital Status: married, Married.  Children: none, No.  EDUCATION: [employer].  OCCUPATION: employed, Scientist, research (life sciences) estate.  Seat belt use: always.  Firearms at home: no.        Medications: Taking Aleve(Naproxen) 220 MG Tablet 1 tablet with food or milk as needed Orally every 12 hrs as needed, Notes: PRN, Taking Claritin(Loratadine) 10 MG Tablet 1 tablet Orally Once a  day, Notes: PRN, Taking Norethindrone Acet-Ethinyl Est 1-20 MG-MCG(24) Tablet Chewable 1 tablet Orally Once a day, skipping placebo pills, Taking Ibuprofen 200 MG Tablet 2 tablets Orally as needed, Notes: PRN, Taking Sudafed(Pseudoephedrine HCl) 30 MG Tablet 1 tablet as needed Orally every 6 hrs, Notes: PRN, Taking Benadryl(DiphenhydrAMINE HCl) 25 mg Capsule 1 capsule Orally as needed, Notes:  PRN, Medication List reviewed and reconciled with the patient       Allergies: N.K.D.A.   O: Examination performed in office:  Vitals: Wt 181.9, Wt change 1.3 lb, Ht 65.75, BMI 29.58, Pulse sitting 79, BP sitting 121/71, LMP: alittle spotting 06/19/17.       Examination:  General Examination:  CONSTITUTIONAL: well developed, well nourished.  SKIN: warm and dry, no rashes.  NECK: supple, normal appearance.  LUNGS: clear to auscultation bilaterally, no wheezes, rhonchi, rales.  HEART: no murmurs, regular rate and rhythm.  ABDOMEN: soft and not tender, no masses palpated, no rebound, no rigidity.  FEMALE GENITOURINARY: normal external genitalia, labia - unremarkable, vagina - pink moist mucosa, no lesions or abnormal discharge, cervix - no discharge or lesions or CMT, adnexa - no masses or tenderness, uterus - nontender and normal size on palpation.  MUSCULOSKELETAL no calf tenderness bilaterally.  EXTREMITIES: no edema present.  PSYCH: appropriate mood and affect.    A.P: 47yo G0 who presents for total laparoscopic hysterectomy and right oophorectomy due to endometriosis, abnormal bleeding and pelvic pain -NPO -LR @ 125cc/hr -SCDs to OR -Ancef IV to OR -Benefits and risk including but not limited to risk of bleeding, infection and injury reviewed with patient.  Questions and concerns were addressed and she desires to proceed.  Janyth Pupa, DO [phone] (cell) [phone] (office)

## 2017-07-15 NOTE — Patient Instructions (Addendum)
Your procedure is scheduled on: Wednesday July 23, 2017 at 10:30 am  Enter through the Main Entrance of [facility] at:9:00 am  Pick up the phone at the desk and dial [phone].  Call this number if you have problems the morning of surgery: [phone].  Remember: Do NOT eat food or drink any liquids: after Midnight on Tuesday April 23   Take these medicines the morning of surgery with a SIP OF WATER: CLARITIN, TAKE SUDAFED IF NEEDED  STOP ALL VITAMINS AND SUPPLEMENTS 1 WEEK PRIOR TO SURGERY  DO NOT SMOKE DAY OF SURGERY  Do NOT wear jewelry (body piercing), metal hair clips/bobby pins, make-up, or nail polish. Do NOT wear lotions, powders, or perfumes.  You may wear deoderant. Do NOT shave for 48 hours prior to surgery. Do NOT bring valuables to the hospital. Contacts, dentures, or bridgework may not be worn into surgery. Leave suitcase in car.  After surgery it may be brought to your room.    For patients admitted to the hospital, checkout time is 11:00 AM the day of discharge.

## 2017-07-16 ENCOUNTER — Other Ambulatory Visit: Payer: Self-pay

## 2017-07-16 ENCOUNTER — Encounter (HOSPITAL_COMMUNITY)
Admission: RE | Admit: 2017-07-16 | Discharge: 2017-07-16 | Disposition: A | Discharge status: Home / Self Care | Payer: BLUE CROSS/BLUE SHIELD | Source: Ambulatory Visit | Attending: Obstetrics & Gynecology | Admitting: Obstetrics & Gynecology

## 2017-07-16 ENCOUNTER — Encounter (HOSPITAL_COMMUNITY): Payer: Self-pay

## 2017-07-16 DIAGNOSIS — Z01812 Encounter for preprocedural laboratory examination: Secondary | ICD-10-CM | POA: Diagnosis not present

## 2017-07-16 HISTORY — DX: Varicose veins of bilateral lower extremities with pain: I83.813

## 2017-07-16 LAB — COMPREHENSIVE METABOLIC PANEL
ALT: 17 U/L (ref 14–54)
AST: 19 U/L (ref 15–41)
Albumin: 3.8 g/dL (ref 3.5–5.0)
Alkaline Phosphatase: 31 U/L — ABNORMAL LOW (ref 38–126)
Anion gap: 10 (ref 5–15)
BILIRUBIN TOTAL: 0.3 mg/dL (ref 0.3–1.2)
BUN: 11 mg/dL (ref 6–20)
CO2: 21 mmol/L — ABNORMAL LOW (ref 22–32)
CREATININE: 0.71 mg/dL (ref 0.44–1.00)
Calcium: 9.1 mg/dL (ref 8.9–10.3)
Chloride: 105 mmol/L (ref 101–111)
GFR calc Af Amer: >60 mL/min (ref >60)
Glucose, Bld: 86 mg/dL (ref 65–99)
Potassium: 3.8 mmol/L (ref 3.5–5.1)
Sodium: 136 mmol/L (ref 135–145)
TOTAL PROTEIN: 6.9 g/dL (ref 6.5–8.1)

## 2017-07-16 LAB — CBC
HEMATOCRIT: 38 % (ref 36.0–46.0)
HEMOGLOBIN: 13.1 g/dL (ref 12.0–15.0)
MCH: 32.7 pg (ref 26.0–34.0)
MCHC: 34.5 g/dL (ref 30.0–36.0)
MCV: 94.8 fL (ref 78.0–100.0)
Platelets: 225 10*3/uL (ref 150–400)
RBC: 4.01 MIL/uL (ref 3.87–5.11)
RDW: 13.1 % (ref 11.5–15.5)
WBC: 8.6 10*3/uL (ref 4.0–10.5)

## 2017-07-16 LAB — TYPE AND SCREEN
ABO/RH(D): A POS
Antibody Screen: NEGATIVE

## 2017-07-23 ENCOUNTER — Encounter (HOSPITAL_COMMUNITY): Payer: Self-pay | Admitting: *Deleted

## 2017-07-23 ENCOUNTER — Ambulatory Visit (HOSPITAL_COMMUNITY): Payer: BLUE CROSS/BLUE SHIELD | Admitting: Anesthesiology

## 2017-07-23 ENCOUNTER — Observation Stay (HOSPITAL_COMMUNITY)
Admission: RE | Admit: 2017-07-23 | Discharge: 2017-07-24 | Disposition: A | Discharge status: Home / Self Care | Payer: BLUE CROSS/BLUE SHIELD | Source: Ambulatory Visit | Attending: Obstetrics & Gynecology | Admitting: Obstetrics & Gynecology

## 2017-07-23 ENCOUNTER — Other Ambulatory Visit: Payer: Self-pay

## 2017-07-23 ENCOUNTER — Encounter (HOSPITAL_COMMUNITY)
Admission: RE | Disposition: A | Discharge status: Home / Self Care | Payer: Self-pay | Source: Ambulatory Visit | Attending: Obstetrics & Gynecology

## 2017-07-23 DIAGNOSIS — N939 Abnormal uterine and vaginal bleeding, unspecified: Principal | ICD-10-CM | POA: Insufficient documentation

## 2017-07-23 DIAGNOSIS — K660 Peritoneal adhesions (postprocedural) (postinfection): Secondary | ICD-10-CM | POA: Diagnosis not present

## 2017-07-23 DIAGNOSIS — R102 Pelvic and perineal pain: Secondary | ICD-10-CM | POA: Diagnosis not present

## 2017-07-23 DIAGNOSIS — D259 Leiomyoma of uterus, unspecified: Secondary | ICD-10-CM | POA: Diagnosis not present

## 2017-07-23 DIAGNOSIS — N809 Endometriosis, unspecified: Secondary | ICD-10-CM | POA: Diagnosis not present

## 2017-07-23 DIAGNOSIS — Z793 Long term (current) use of hormonal contraceptives: Secondary | ICD-10-CM | POA: Diagnosis not present

## 2017-07-23 DIAGNOSIS — N920 Excessive and frequent menstruation with regular cycle: Secondary | ICD-10-CM | POA: Diagnosis not present

## 2017-07-23 HISTORY — DX: Other seasonal allergic rhinitis: J30.2

## 2017-07-23 HISTORY — PX: LAPAROSCOPIC HYSTERECTOMY: SHX1926

## 2017-07-23 SURGERY — HYSTERECTOMY, TOTAL, LAPAROSCOPIC
Anesthesia: General | Site: Abdomen

## 2017-07-23 MED ORDER — KETOROLAC TROMETHAMINE 30 MG/ML IJ SOLN
INTRAMUSCULAR | Status: AC
  Filled 2017-07-23: qty 1

## 2017-07-23 MED ORDER — METOCLOPRAMIDE HCL 5 MG/ML IJ SOLN
INTRAMUSCULAR | Status: DC | PRN
  Administered 2017-07-23: 10 mg via INTRAVENOUS

## 2017-07-23 MED ORDER — GABAPENTIN 300 MG PO CAPS
300.0000 mg | ORAL_CAPSULE | Freq: Once | ORAL | Status: AC
Start: 2017-07-23 — End: 2017-07-23
  Administered 2017-07-23: 300 mg via ORAL

## 2017-07-23 MED ORDER — MIDAZOLAM HCL 2 MG/2ML IJ SOLN
INTRAMUSCULAR | Status: AC
  Filled 2017-07-23: qty 2

## 2017-07-23 MED ORDER — FENTANYL CITRATE (PF) 100 MCG/2ML IJ SOLN
INTRAMUSCULAR | Status: DC | PRN
  Administered 2017-07-23: 50 ug via INTRAVENOUS
  Administered 2017-07-23: 100 ug via INTRAVENOUS
  Administered 2017-07-23 (×2): 50 ug via INTRAVENOUS

## 2017-07-23 MED ORDER — SCOPOLAMINE 1 MG/3DAYS TD PT72
MEDICATED_PATCH | TRANSDERMAL | Status: AC
  Filled 2017-07-23: qty 1

## 2017-07-23 MED ORDER — GABAPENTIN 300 MG PO CAPS: 300.0000 mg | ORAL_CAPSULE | ORAL | Status: DC

## 2017-07-23 MED ORDER — PROPOFOL 10 MG/ML IV BOLUS
INTRAVENOUS | Status: AC
  Filled 2017-07-23: qty 20

## 2017-07-23 MED ORDER — SODIUM CHLORIDE 0.9 % IR SOLN
Status: DC | PRN
  Administered 2017-07-23: 3000 mL

## 2017-07-23 MED ORDER — ONDANSETRON HCL 4 MG PO TABS: 4.0000 mg | ORAL_TABLET | Freq: Four times a day (QID) | ORAL | Status: DC | PRN

## 2017-07-23 MED ORDER — BUPIVACAINE HCL (PF) 0.25 % IJ SOLN
INTRAMUSCULAR | Status: DC | PRN
  Administered 2017-07-23: 30 mL

## 2017-07-23 MED ORDER — KETOROLAC TROMETHAMINE 30 MG/ML IJ SOLN
30.0000 mg | Freq: Four times a day (QID) | INTRAMUSCULAR | Status: DC
  Administered 2017-07-23 – 2017-07-24 (×4): 30 mg via INTRAVENOUS
  Filled 2017-07-23 (×4): qty 1

## 2017-07-23 MED ORDER — KETOROLAC TROMETHAMINE 30 MG/ML IJ SOLN
30.0000 mg | Freq: Four times a day (QID) | INTRAMUSCULAR | Status: DC
  Filled 2017-07-23: qty 1

## 2017-07-23 MED ORDER — ONDANSETRON HCL 4 MG/2ML IJ SOLN
INTRAMUSCULAR | Status: AC
  Filled 2017-07-23: qty 2

## 2017-07-23 MED ORDER — HYDROMORPHONE HCL 1 MG/ML IJ SOLN
INTRAMUSCULAR | Status: AC
  Filled 2017-07-23: qty 1

## 2017-07-23 MED ORDER — ROCURONIUM BROMIDE 100 MG/10ML IV SOLN
INTRAVENOUS | Status: DC | PRN
  Administered 2017-07-23: 50 mg via INTRAVENOUS

## 2017-07-23 MED ORDER — ONDANSETRON HCL 4 MG/2ML IJ SOLN
INTRAMUSCULAR | Status: DC | PRN
  Administered 2017-07-23: 4 mg via INTRAVENOUS

## 2017-07-23 MED ORDER — FENTANYL CITRATE (PF) 250 MCG/5ML IJ SOLN
INTRAMUSCULAR | Status: AC
  Filled 2017-07-23: qty 5

## 2017-07-23 MED ORDER — PANTOPRAZOLE SODIUM 40 MG PO TBEC
40.0000 mg | DELAYED_RELEASE_TABLET | Freq: Every day | ORAL | Status: DC
  Administered 2017-07-24: 40 mg via ORAL
  Filled 2017-07-23: qty 1

## 2017-07-23 MED ORDER — DEXAMETHASONE SODIUM PHOSPHATE 10 MG/ML IJ SOLN
INTRAMUSCULAR | Status: AC
  Filled 2017-07-23: qty 1

## 2017-07-23 MED ORDER — SIMETHICONE 80 MG PO CHEW: 80.0000 mg | CHEWABLE_TABLET | Freq: Four times a day (QID) | ORAL | Status: DC | PRN

## 2017-07-23 MED ORDER — CEFAZOLIN SODIUM-DEXTROSE 2-4 GM/100ML-% IV SOLN
2.0000 g | INTRAVENOUS | Status: AC
  Administered 2017-07-23: 2 g via INTRAVENOUS

## 2017-07-23 MED ORDER — ESTRADIOL 0.1 MG/GM VA CREA
TOPICAL_CREAM | VAGINAL | Status: AC
  Filled 2017-07-23: qty 42.5

## 2017-07-23 MED ORDER — SUGAMMADEX SODIUM 200 MG/2ML IV SOLN
INTRAVENOUS | Status: AC
  Filled 2017-07-23: qty 2

## 2017-07-23 MED ORDER — MIDAZOLAM HCL 2 MG/2ML IJ SOLN
INTRAMUSCULAR | Status: DC | PRN
  Administered 2017-07-23: 1 mg via INTRAVENOUS

## 2017-07-23 MED ORDER — PROMETHAZINE HCL 25 MG/ML IJ SOLN: 6.2500 mg | INTRAMUSCULAR | Status: DC | PRN

## 2017-07-23 MED ORDER — ONDANSETRON HCL 4 MG/2ML IJ SOLN
4.0000 mg | Freq: Four times a day (QID) | INTRAMUSCULAR | Status: DC | PRN
  Administered 2017-07-23: 4 mg via INTRAVENOUS
  Filled 2017-07-23: qty 2

## 2017-07-23 MED ORDER — LORATADINE 10 MG PO TABS
10.0000 mg | ORAL_TABLET | Freq: Every day | ORAL | Status: DC
  Administered 2017-07-24: 10 mg via ORAL
  Filled 2017-07-23: qty 1

## 2017-07-23 MED ORDER — MENTHOL 3 MG MT LOZG: 1.0000 | LOZENGE | OROMUCOSAL | Status: DC | PRN

## 2017-07-23 MED ORDER — ACETAMINOPHEN 500 MG PO TABS: 1000.0000 mg | ORAL_TABLET | ORAL | Status: DC

## 2017-07-23 MED ORDER — PROPOFOL 10 MG/ML IV BOLUS
INTRAVENOUS | Status: DC | PRN
  Administered 2017-07-23: 160 mg via INTRAVENOUS

## 2017-07-23 MED ORDER — GABAPENTIN 300 MG PO CAPS
ORAL_CAPSULE | ORAL | Status: AC
  Filled 2017-07-23: qty 1

## 2017-07-23 MED ORDER — LIDOCAINE HCL (CARDIAC) PF 100 MG/5ML IV SOSY
PREFILLED_SYRINGE | INTRAVENOUS | Status: DC | PRN
  Administered 2017-07-23: 100 mg via INTRAVENOUS

## 2017-07-23 MED ORDER — FENTANYL CITRATE (PF) 100 MCG/2ML IJ SOLN: 25.0000 ug | INTRAMUSCULAR | Status: DC | PRN

## 2017-07-23 MED ORDER — BUPIVACAINE HCL (PF) 0.25 % IJ SOLN
INTRAMUSCULAR | Status: AC
  Filled 2017-07-23: qty 30

## 2017-07-23 MED ORDER — CELECOXIB 400 MG PO CAPS: 400.0000 mg | ORAL_CAPSULE | ORAL | Status: DC

## 2017-07-23 MED ORDER — OXYCODONE-ACETAMINOPHEN 5-325 MG PO TABS
1.0000 | ORAL_TABLET | ORAL | Status: DC | PRN
  Administered 2017-07-23 – 2017-07-24 (×3): 1 via ORAL
  Filled 2017-07-23 (×3): qty 1

## 2017-07-23 MED ORDER — ACETAMINOPHEN 500 MG PO TABS
ORAL_TABLET | ORAL | Status: AC
  Filled 2017-07-23: qty 2

## 2017-07-23 MED ORDER — SODIUM CHLORIDE 0.9 % IJ SOLN
INTRAMUSCULAR | Status: DC | PRN
  Administered 2017-07-23: 10 mL

## 2017-07-23 MED ORDER — SUGAMMADEX SODIUM 200 MG/2ML IV SOLN
INTRAVENOUS | Status: DC | PRN
  Administered 2017-07-23: 160 mg via INTRAVENOUS

## 2017-07-23 MED ORDER — HYDROMORPHONE HCL 1 MG/ML IJ SOLN
INTRAMUSCULAR | Status: DC | PRN
  Administered 2017-07-23 (×2): 1 mg via INTRAVENOUS

## 2017-07-23 MED ORDER — LACTATED RINGERS IV SOLN
INTRAVENOUS | Status: DC
  Administered 2017-07-23 (×2): via INTRAVENOUS

## 2017-07-23 MED ORDER — GLYCOPYRROLATE 0.2 MG/ML IJ SOLN
INTRAMUSCULAR | Status: DC | PRN
  Administered 2017-07-23: 0.1 mg via INTRAVENOUS

## 2017-07-23 MED ORDER — DOCUSATE SODIUM 100 MG PO CAPS
100.0000 mg | ORAL_CAPSULE | Freq: Two times a day (BID) | ORAL | Status: DC
  Administered 2017-07-23 – 2017-07-24 (×2): 100 mg via ORAL
  Filled 2017-07-23 (×2): qty 1

## 2017-07-23 MED ORDER — LACTATED RINGERS IV SOLN
INTRAVENOUS | Status: DC
  Administered 2017-07-23 (×2): via INTRAVENOUS

## 2017-07-23 MED ORDER — CEFAZOLIN SODIUM-DEXTROSE 2-4 GM/100ML-% IV SOLN
INTRAVENOUS | Status: AC
  Filled 2017-07-23: qty 100

## 2017-07-23 MED ORDER — SCOPOLAMINE 1 MG/3DAYS TD PT72
1.0000 | MEDICATED_PATCH | TRANSDERMAL | Status: DC
  Administered 2017-07-23: 1.5 mg via TRANSDERMAL

## 2017-07-23 MED ORDER — SODIUM CHLORIDE 0.9 % IJ SOLN
INTRAMUSCULAR | Status: AC
  Filled 2017-07-23: qty 10

## 2017-07-23 MED ORDER — ROCURONIUM BROMIDE 100 MG/10ML IV SOLN
INTRAVENOUS | Status: AC
  Filled 2017-07-23: qty 1

## 2017-07-23 MED ORDER — DEXAMETHASONE SODIUM PHOSPHATE 10 MG/ML IJ SOLN
INTRAMUSCULAR | Status: DC | PRN
  Administered 2017-07-23: 10 mg via INTRAVENOUS

## 2017-07-23 MED ORDER — ACETAMINOPHEN 500 MG PO TABS
1000.0000 mg | ORAL_TABLET | Freq: Once | ORAL | Status: AC
  Administered 2017-07-23: 1000 mg via ORAL

## 2017-07-23 MED ORDER — LIDOCAINE HCL (CARDIAC) PF 100 MG/5ML IV SOSY
PREFILLED_SYRINGE | INTRAVENOUS | Status: AC
  Filled 2017-07-23: qty 5

## 2017-07-23 MED ORDER — ESTRADIOL 0.1 MG/GM VA CREA
TOPICAL_CREAM | VAGINAL | Status: DC | PRN
  Administered 2017-07-23: 1 via VAGINAL

## 2017-07-23 SURGICAL SUPPLY — 48 items
APPLICATOR ARISTA FLEXITIP XL (MISCELLANEOUS) ×3 IMPLANT
CABLE HIGH FREQUENCY MONO STRZ (ELECTRODE) ×6 IMPLANT
COVER MAYO STAND STRL (DRAPES) ×3 IMPLANT
DERMABOND ADVANCED (GAUZE/BANDAGES/DRESSINGS) ×2
DERMABOND ADVANCED .7 DNX12 (GAUZE/BANDAGES/DRESSINGS) ×1 IMPLANT
DISSECTOR BLUNT TIP ENDO 5MM (MISCELLANEOUS) ×3 IMPLANT
DRSG OPSITE POSTOP 3X4 (GAUZE/BANDAGES/DRESSINGS) IMPLANT
DURAPREP 26ML APPLICATOR (WOUND CARE) ×3 IMPLANT
GAUZE PACKING 1/2X5YD (GAUZE/BANDAGES/DRESSINGS) ×3 IMPLANT
GAUZE SPONGE 4X4 16PLY XRAY LF (GAUZE/BANDAGES/DRESSINGS) ×3 IMPLANT
GLOVE BIOGEL PI IND STRL 6.5 (GLOVE) ×3 IMPLANT
GLOVE BIOGEL PI IND STRL 7.0 (GLOVE) ×2 IMPLANT
GLOVE BIOGEL PI INDICATOR 6.5 (GLOVE) ×6
GLOVE BIOGEL PI INDICATOR 7.0 (GLOVE) ×4
GLOVE ECLIPSE 6.5 STRL STRAW (GLOVE) ×6 IMPLANT
HEMOSTAT ARISTA ABSORB 3G PWDR (MISCELLANEOUS) ×3 IMPLANT
NEEDLE INSUFFLATION 120MM (ENDOMECHANICALS) ×3 IMPLANT
OCCLUDER COLPOPNEUMO (BALLOONS) ×3 IMPLANT
PACK LAPAROSCOPY BASIN (CUSTOM PROCEDURE TRAY) ×3 IMPLANT
PACK ROBOTIC GOWN (GOWN DISPOSABLE) ×3 IMPLANT
PACK TRENDGUARD 450 HYBRID PRO (MISCELLANEOUS) ×1 IMPLANT
PACK TRENDGUARD 600 HYBRD PROC (MISCELLANEOUS) IMPLANT
PAD OB MATERNITY 4.3X12.25 (PERSONAL CARE ITEMS) ×3 IMPLANT
POUCH LAPAROSCOPIC INSTRUMENT (MISCELLANEOUS) ×3 IMPLANT
PROTECTOR NERVE ULNAR (MISCELLANEOUS) ×6 IMPLANT
SET IRRIG TUBING LAPAROSCOPIC (IRRIGATION / IRRIGATOR) ×3 IMPLANT
SHEARS HARMONIC ACE PLUS 36CM (ENDOMECHANICALS) ×3 IMPLANT
SLEEVE XCEL OPT CAN 5 100 (ENDOMECHANICALS) ×6 IMPLANT
SPONGE LAP 18X18 X RAY DECT (DISPOSABLE) ×3 IMPLANT
SUT MON AB 4-0 PS1 27 (SUTURE) ×3 IMPLANT
SUT VIC AB 0 CT1 18XCR BRD8 (SUTURE) ×1 IMPLANT
SUT VIC AB 0 CT1 27 (SUTURE) ×4
SUT VIC AB 0 CT1 27XBRD ANBCTR (SUTURE) ×2 IMPLANT
SUT VIC AB 0 CT1 36 (SUTURE) ×6 IMPLANT
SUT VIC AB 0 CT1 8-18 (SUTURE) ×2
SUT VICRYL 0 UR6 27IN ABS (SUTURE) ×6 IMPLANT
SYR 50ML LL SCALE MARK (SYRINGE) ×3 IMPLANT
SYRINGE 10CC LL (SYRINGE) ×3 IMPLANT
TIP UTERINE 6.7X8CM BLUE DISP (MISCELLANEOUS) ×3 IMPLANT
TOWEL OR 17X24 6PK STRL BLUE (TOWEL DISPOSABLE) ×6 IMPLANT
TRAY FOLEY W/BAG SLVR 14FR (SET/KITS/TRAYS/PACK) ×3 IMPLANT
TRENDGUARD 450 HYBRID PRO PACK (MISCELLANEOUS) ×3
TRENDGUARD 600 HYBRID PROC PK (MISCELLANEOUS)
TROCAR XCEL NON-BLD 5MMX100MML (ENDOMECHANICALS) ×3 IMPLANT
TUBING NON-CON 1/4 X 20 CONN (TUBING) ×2 IMPLANT
TUBING NON-CON 1/4 X 20' CONN (TUBING) ×1
WARMER LAPAROSCOPE (MISCELLANEOUS) ×3 IMPLANT
YANKAUER SUCT BULB TIP NO VENT (SUCTIONS) ×3 IMPLANT

## 2017-07-23 NOTE — Anesthesia Preprocedure Evaluation (Signed)
Anesthesia Evaluation  Patient identified by MRN, date of birth, ID band Patient awake    Reviewed: Allergy & Precautions, NPO status , Patient's Chart, lab work & pertinent test results  Airway Mallampati: II  TM Distance: >3 FB Neck ROM: Full    Dental  (+) Teeth Intact, Dental Advisory Given   Pulmonary neg pulmonary ROS,    Pulmonary exam normal breath sounds clear to auscultation       Cardiovascular negative cardio ROS Normal cardiovascular exam Rhythm:Regular Rate:Normal     Neuro/Psych negative neurological ROS     GI/Hepatic negative GI ROS, (+)     substance abuse  marijuana use,   Endo/Other  negative endocrine ROS  Renal/GU negative Renal ROS     Musculoskeletal negative musculoskeletal ROS (+)   Abdominal   Peds  Hematology negative hematology ROS (+)   Anesthesia Other Findings Day of surgery medications reviewed with the patient.  Reproductive/Obstetrics -Endometrosis determined by laparoscopy  -Menorrhagia                             Anesthesia Physical Anesthesia Plan  ASA: II  Anesthesia Plan: General   Post-op Pain Management:    Induction: Intravenous  PONV Risk Score and Plan: 4 or greater and Scopolamine patch - Pre-op, Midazolam, Dexamethasone and Ondansetron  Airway Management Planned: Oral ETT  Additional Equipment:   Intra-op Plan:   Post-operative Plan: Extubation in OR  Informed Consent: I have reviewed the patients History and Physical, chart, labs and discussed the procedure including the risks, benefits and alternatives for the proposed anesthesia with the patient or authorized representative who has indicated his/her understanding and acceptance.   Dental advisory given  Plan Discussed with: CRNA  Anesthesia Plan Comments: (Risks/benefits of general anesthesia discussed with patient including risk of damage to teeth, lips, gum, and  tongue, nausea/vomiting, allergic reactions to medications, and the possibility of heart attack, stroke and death.  All patient questions answered.  Patient wishes to proceed.)        Anesthesia Quick Evaluation

## 2017-07-23 NOTE — Anesthesia Procedure Notes (Signed)
Procedure Name: Intubation Date/Time: 07/23/2017 10:58 AM Performed by: Flossie Dibble, CRNA Pre-anesthesia Checklist: Patient identified, Patient being monitored, Timeout performed, Emergency Drugs available and Suction available Patient Re-evaluated:Patient Re-evaluated prior to induction Oxygen Delivery Method: Circle System Utilized Preoxygenation: Pre-oxygenation with 100% oxygen Induction Type: IV induction Ventilation: Mask ventilation without difficulty Laryngoscope Size: Mac and 3 Grade View: Grade I Tube type: Oral Tube size: 7.0 mm Number of attempts: 1 Airway Equipment and Method: stylet Placement Confirmation: ETT inserted through vocal cords under direct vision,  positive ETCO2 and breath sounds checked- equal and bilateral Secured at: 21 cm Tube secured with: Tape Dental Injury: Teeth and Oropharynx as per pre-operative assessment

## 2017-07-23 NOTE — Interval H&P Note (Signed)
History and Physical Interval Note:  07/23/2017 9:58 AM  Ellen Galloway  has presented today for surgery, with the diagnosis of Endometrosis determined by laparoscopy  Menorrhagia  The various methods of treatment have been discussed with the patient and family. After consideration of risks, benefits and other options for treatment, the patient has consented to  Procedure(s) with comments: HYSTERECTOMY TOTAL LAPAROSCOPIC (N/A) - with Right oopherectomy as a surgical intervention .  The patient's history has been reviewed, patient examined, no change in status, stable for surgery.  I have reviewed the patient's chart and labs.  Questions were answered to the patient's satisfaction.     Annalee Genta

## 2017-07-23 NOTE — Transfer of Care (Signed)
Immediate Anesthesia Transfer of Care Note  Patient: Ellen Galloway  Procedure(s) Performed: HYSTERECTOMY TOTAL LAPAROSCOPIC (N/A Abdomen)  Patient Location: PACU  Anesthesia Type:General  Level of Consciousness: awake, alert  and oriented  Airway & Oxygen Therapy: Patient Spontanous Breathing and Patient connected to nasal cannula oxygen  Post-op Assessment: Report given to RN and Post -op Vital signs reviewed and stable  Post vital signs: Reviewed and stable  Last Vitals:  Vitals Value Taken Time  BP 117/55 07/23/2017  1:13 PM  Temp    Pulse 64 07/23/2017  1:14 PM  Resp 17 07/23/2017  1:14 PM  SpO2 100 % 07/23/2017  1:14 PM  Vitals shown include unvalidated device data.  Last Pain:  Vitals:   07/23/17 0909  TempSrc: Oral  PainSc: 0-No pain      Patients Stated Pain Goal: 3 (10/93/23 5573)  Complications: No apparent anesthesia complications

## 2017-07-23 NOTE — Anesthesia Postprocedure Evaluation (Signed)
Anesthesia Post Note  Patient: Ellen Galloway  Procedure(s) Performed: HYSTERECTOMY TOTAL LAPAROSCOPIC, Right Oophrectomy (N/A Abdomen)     Patient location during evaluation: PACU Anesthesia Type: General Level of consciousness: awake and alert Pain management: pain level controlled Vital Signs Assessment: post-procedure vital signs reviewed and stable Respiratory status: spontaneous breathing, nonlabored ventilation, respiratory function stable and patient connected to nasal cannula oxygen Cardiovascular status: blood pressure returned to baseline and stable Postop Assessment: no apparent nausea or vomiting Anesthetic complications: no    Last Vitals:  Vitals:   07/23/17 1428 07/23/17 1525  BP: (!) 109/53 (!) 107/53  Pulse: (!) 57 70  Resp:  16  Temp:  36.6 C  SpO2: 97% 97%    Last Pain:  Vitals:   07/23/17 1630  TempSrc:   PainSc: 0-No pain   Pain Goal: Patients Stated Pain Goal: 3 (07/23/17 1530)               Catalina Gravel

## 2017-07-23 NOTE — Op Note (Signed)
Preoperative Diagnosis: Pelvic pain, endometriosis, abnormal uterine bleeding Postoperative Diagnosis: same  Procedure:  Total laparoscopic hysterectomy, right oophorectomy  Surgeon: Dr. Janyth Pupa  Assistant:  Dr. Christophe Louis  Anesthetic:  General  Disposition:  The patient presents with the above-mentioned diagnosis. She understands the indications for surgical procedure.  She also understands the alternative treatment options. She accepts the risk of, but not limited to, anesthetic complications, bleeding, infections, and possible damage to the surrounding organs.  Findings: 9 cm anteverted uterus, normal right ovary, moderate bowel adhesions noted to abdominal side wall- most noticeable near appendix  Procedure:  The patient was taken to the operating room where a general anesthetic was given. The patient's  abdomen was prepped with ChloraPrep. The perineum and vagina were prepped with multiple layers of Betadine. A Foley catheter was placed in the bladder. An examination under anesthesia was performed. A Hulka tenaculum was placed inside the uterus. The patient was sterilely draped. The subumbilical area was injected with half percent Marcaine with epinephrine. An incision was made and the Veress needle was inserted into the abdominal cavity without difficulty. Proper placement was confirmed using the saline drop test. A pneumoperitoneum was obtained. The laparoscopic trocar and the laparoscope were substituted for the Veress needle. Operative findings as mentioned above. The mid abdomen was injected with half percent Marcaine with epinephrine to the right of the midline. A small incision was made and a 5 mm trocar was inserted into the abdominal cavity under direct visualization. An identical procedure was carried out on the opposite side. Pictures were taken of the patient's pelvic structures. The right ureter was identified.  The right round ligament was cauterized and  cut using the harmonic. The right IP was cauterized and cut using the Harmonic.  The vesicouterine flap was created.  Attention was turned to the left side, the left round ligament was cauterized with the harmonic.  The uterine artery were skeletonized bilaterally and ligated.  Attention was turned back to the bladder and the vesicouterine flap was further developed, the cuff was palpable.  The harmonic as well as the laparoscopic hook were used to make a circumferential incision around the cuff.  Attention was turned vaginally.  The uterus was removed from the operative field.   Angle stitches were placed.  The cuff was closed in a running locked fashion.  Hemostasis was confirmed.  Gown and gloves were changed and the case proceeded laparoscopically. The pneumoperitoneum was reestablished. The pelvis was inspected and hemostasis was adequate. The pelvis was irrigated. Arista was placed.  The 5 mm trocars were removed under direct visualization. The pneumoperitoneum was allowed to escape. Dermabond was placed over the incisions.  The patient tolerated her procedure well. She was awakened from her anesthetic without difficulty and then transported to the recovery room in stable condition. Sponge, needle, and instrument counts were correct.  The uterus was sent to pathology.  Janyth Pupa, DO [phone] (cell) [phone] (office)

## 2017-07-24 ENCOUNTER — Encounter (HOSPITAL_COMMUNITY): Payer: Self-pay | Admitting: Obstetrics & Gynecology

## 2017-07-24 DIAGNOSIS — N939 Abnormal uterine and vaginal bleeding, unspecified: Secondary | ICD-10-CM | POA: Diagnosis not present

## 2017-07-24 DIAGNOSIS — D259 Leiomyoma of uterus, unspecified: Secondary | ICD-10-CM | POA: Diagnosis not present

## 2017-07-24 DIAGNOSIS — K660 Peritoneal adhesions (postprocedural) (postinfection): Secondary | ICD-10-CM | POA: Diagnosis not present

## 2017-07-24 DIAGNOSIS — Z793 Long term (current) use of hormonal contraceptives: Secondary | ICD-10-CM | POA: Diagnosis not present

## 2017-07-24 DIAGNOSIS — R102 Pelvic and perineal pain: Secondary | ICD-10-CM | POA: Diagnosis not present

## 2017-07-24 DIAGNOSIS — N809 Endometriosis, unspecified: Secondary | ICD-10-CM | POA: Diagnosis not present

## 2017-07-24 LAB — CBC
HCT: 31.5 % — ABNORMAL LOW (ref 36.0–46.0)
Hemoglobin: 10.7 g/dL — ABNORMAL LOW (ref 12.0–15.0)
MCH: 32.8 pg (ref 26.0–34.0)
MCHC: 34 g/dL (ref 30.0–36.0)
MCV: 96.6 fL (ref 78.0–100.0)
PLATELETS: 191 10*3/uL (ref 150–400)
RBC: 3.26 MIL/uL — ABNORMAL LOW (ref 3.87–5.11)
RDW: 13.4 % (ref 11.5–15.5)
WBC: 16.7 10*3/uL — AB (ref 4.0–10.5)

## 2017-07-24 LAB — BASIC METABOLIC PANEL
ANION GAP: 7 (ref 5–15)
BUN: 7 mg/dL (ref 6–20)
CALCIUM: 8.1 mg/dL — AB (ref 8.9–10.3)
CO2: 21 mmol/L — ABNORMAL LOW (ref 22–32)
Chloride: 108 mmol/L (ref 101–111)
Creatinine, Ser: 0.73 mg/dL (ref 0.44–1.00)
GFR calc Af Amer: >60 mL/min (ref >60)
Glucose, Bld: 135 mg/dL — ABNORMAL HIGH (ref 65–99)
Potassium: 3.6 mmol/L (ref 3.5–5.1)
SODIUM: 136 mmol/L (ref 135–145)

## 2017-07-24 MED ORDER — NAPROXEN 500 MG PO TABS: 500.0000 mg | ORAL_TABLET | Freq: Two times a day (BID) | ORAL | 1 refills | Status: DC

## 2017-07-24 MED ORDER — OXYCODONE-ACETAMINOPHEN 5-325 MG PO TABS: 1.0000 | ORAL_TABLET | Freq: Four times a day (QID) | ORAL | 0 refills | Status: AC | PRN

## 2017-07-24 MED ORDER — DOCUSATE SODIUM 100 MG PO CAPS: 100.0000 mg | ORAL_CAPSULE | Freq: Two times a day (BID) | ORAL | 0 refills | Status: DC

## 2017-07-24 MED ORDER — NAPROXEN 500 MG PO TABS
500.0000 mg | ORAL_TABLET | Freq: Two times a day (BID) | ORAL | Status: DC
  Filled 2017-07-24: qty 1

## 2017-07-24 NOTE — Anesthesia Postprocedure Evaluation (Signed)
Anesthesia Post Note  Patient: Ellen Galloway  Procedure(s) Performed: HYSTERECTOMY TOTAL LAPAROSCOPIC, Right Oophrectomy (N/A Abdomen)     Patient location during evaluation: [facility] Anesthesia Type: General Level of consciousness: awake and alert Pain management: satisfactory to patient Vital Signs Assessment: post-procedure vital signs reviewed and stable Respiratory status: spontaneous breathing and respiratory function stable Cardiovascular status: stable Postop Assessment: adequate PO intake Anesthetic complications: no    Last Vitals:  Vitals:   07/24/17 0400 07/24/17 0819  BP: (!) 117/55 108/64  Pulse: 78 69  Resp: 16 18  Temp: 36.9 C 36.8 C  SpO2: 98% 100%    Last Pain:  Vitals:   07/24/17 0819  TempSrc: Oral  PainSc:    Pain Goal: Patients Stated Pain Goal: 3 (07/24/17 0800)               Katherina Mires

## 2017-07-24 NOTE — Progress Notes (Signed)
Postop Note Day # 1  S:  Patient resting comfortable in bed.  Pain controlled.  Tolerating general diet. Some flatus, no BM.  Minimal bleeding. Ambulating without difficulty.  She denies n/v/f/c, SOB, or CP.   O: Temp:  [97.5 F (36.4 C)-98.9 F (37.2 C)] 98.4 F (36.9 C) (04/25 0400) Pulse Rate:  [57-78] 78 (04/25 0400) Resp:  [9-17] 16 (04/25 0400) BP: (100-135)/(53-85) 117/55 (04/25 0400) SpO2:  [94 %-100 %] 98 % (04/25 0400) Weight:  [81.2 kg (179 lb)] 81.2 kg (179 lb) (04/24 1428) Gen: A&Ox3, NAD CV: RRR, no MRG Resp: CTAB Abdomen: soft, NT, ND +BS Incision: c/d/i, bandage on Ext: No edema, no calf tenderness bilaterally, SCDs in place  Labs:  Results for orders placed or performed during the hospital encounter of 07/23/17 (from the past 24 hour(s))  CBC     Status: Abnormal   Collection Time: 07/24/17  5:40 AM  Result Value Ref Range   WBC 16.7 (H) 4.0 - 10.5 K/uL   RBC 3.26 (L) 3.87 - 5.11 MIL/uL   Hemoglobin 10.7 (L) 12.0 - 15.0 g/dL   HCT 31.5 (L) 36.0 - 46.0 %   MCV 96.6 78.0 - 100.0 fL   MCH 32.8 26.0 - 34.0 pg   MCHC 34.0 30.0 - 36.0 g/dL   RDW 13.4 11.5 - 15.5 %   Platelets 191 150 - 400 K/uL  Basic metabolic panel     Status: Abnormal   Collection Time: 07/24/17  5:40 AM  Result Value Ref Range   Sodium 136 135 - 145 mmol/L   Potassium 3.6 3.5 - 5.1 mmol/L   Chloride 108 101 - 111 mmol/L   CO2 21 (L) 22 - 32 mmol/L   Glucose, Bld 135 (H) 65 - 99 mg/dL   BUN 7 6 - 20 mg/dL   Creatinine, Ser 0.73 0.44 - 1.00 mg/dL   Calcium 8.1 (L) 8.9 - 10.3 mg/dL   GFR calc non Af Amer >60 >60 mL/min   GFR calc Af Amer >60 >60 mL/min   Anion gap 7 5 - 15    A/P: Pt is a 47 y.o. G0 s/p TLH, right oophorectomy, POD#1  - Pain well controlled -GU: UOP is adequate, foley removed this am -GI: Tolerating general diet -Activity: encouraged sitting up to chair and ambulation as tolerated -Prophylaxis: early ambulation -Labs: stable as above  Meeting postop milestones  appropriately, if able to void on her own today, plan for discharge home  Janyth Pupa, DO [phone] (pager) [phone] (office)

## 2017-07-24 NOTE — Progress Notes (Signed)
Discharge instructions given to patient.  Postoperative instructions including follow up care and medication changes discussed.  Paperwork signed.

## 2017-07-24 NOTE — Addendum Note (Signed)
Addendum  created 07/24/17 1237 by Flossie Dibble, CRNA   Sign clinical note

## 2017-07-24 NOTE — Discharge Instructions (Signed)
Total Laparoscopic Hysterectomy, Care After Refer to this sheet in the next few weeks. These instructions provide you with information on caring for yourself after your procedure. Your health care provider may also give you more specific instructions. Your treatment has been planned according to current medical practices, but problems sometimes occur. Call your health care provider if you have any problems or questions after your procedure. What can I expect after the procedure?  Pain and bruising at the incision sites. You will be given pain medicine to control it.  Menopausal symptoms such as hot flashes, night sweats, and insomnia if your ovaries were removed.  Sore throat from the breathing tube that was inserted during surgery. Follow these instructions at home:  Only take over-the-counter or prescription medicines for pain, discomfort, or fever as directed by your health care provider.  You may alternate between Aleve and percocet.  Percocet may cause constipation so be sure to take the stool softener twice daily  Do not take aspirin. It can cause bleeding.  Do not drive when taking pain medicine.  Follow your health care provider's advice regarding diet, exercise, lifting, driving, and general activities.  Resume your usual diet as directed and allowed.  Get plenty of rest and sleep.  Do not douche, use tampons, or have sexual intercourse for at least 6 weeks, or until your health care provider gives you permission.  Change your bandages (dressings) as directed by your health care provider.  Monitor your temperature and notify your health care provider of a fever.  Take showers instead of baths for 2-3 weeks.  Do not drink alcohol until your health care provider gives you permission.  If you develop constipation, you may take a mild laxative with your health care provider's permission. Bran foods may help with constipation problems. Drinking enough fluids to keep your urine  clear or pale yellow may help as well.  Try to have someone home with you for 1-2 weeks to help around the house.  Keep all of your follow-up appointments as directed by your health care provider. Contact a health care provider if:  You have swelling, redness, or increasing pain around your incision sites.  You have pus coming from your incision.  You notice a bad smell coming from your incision.  Your incision breaks open.  You feel dizzy or lightheaded.  You have pain or bleeding when you urinate.  You have persistent diarrhea.  You have persistent nausea and vomiting.  You have abnormal vaginal discharge.  You have a rash.  You have any type of abnormal reaction or develop an allergy to your medicine.  You have poor pain control with your prescribed medicine. Get help right away if:  You have chest pain or shortness of breath.  You have severe abdominal pain that is not relieved with pain medicine.  You have pain or swelling in your legs. This information is not intended to replace advice given to you by your health care provider. Make sure you discuss any questions you have with your health care provider. Document Released: 01/06/2013 Document Revised: 08/24/2015 Document Reviewed: 10/06/2012 Elsevier Interactive Patient Education  2017 [employer].

## 2017-07-24 NOTE — Discharge Summary (Signed)
Physician Discharge Summary  Patient ID: Ellen Galloway MRN: [id number] DOB/AGE: Jun 17, 1970 47 y.o.  Admit date: 07/23/2017 Discharge date: 07/24/2017  Admission Diagnoses:  Discharge Diagnoses:  Active Problems:   Endometriosis   Discharged Condition: stable  Hospital Course: 47yo G0 who presented for scheduled surgery- underwent TLH, right oophorectomy.  See op note for further info.  Postop course was uncomplicated and she was discharged home in stable condition on POD#1  Consults: None  Significant Diagnostic Studies: labs:  Results for orders placed or performed during the hospital encounter of 07/23/17 (from the past 24 hour(s))  CBC     Status: Abnormal   Collection Time: 07/24/17  5:40 AM  Result Value Ref Range   WBC 16.7 (H) 4.0 - 10.5 K/uL   RBC 3.26 (L) 3.87 - 5.11 MIL/uL   Hemoglobin 10.7 (L) 12.0 - 15.0 g/dL   HCT 31.5 (L) 36.0 - 46.0 %   MCV 96.6 78.0 - 100.0 fL   MCH 32.8 26.0 - 34.0 pg   MCHC 34.0 30.0 - 36.0 g/dL   RDW 13.4 11.5 - 15.5 %   Platelets 191 150 - 400 K/uL  Basic metabolic panel     Status: Abnormal   Collection Time: 07/24/17  5:40 AM  Result Value Ref Range   Sodium 136 135 - 145 mmol/L   Potassium 3.6 3.5 - 5.1 mmol/L   Chloride 108 101 - 111 mmol/L   CO2 21 (L) 22 - 32 mmol/L   Glucose, Bld 135 (H) 65 - 99 mg/dL   BUN 7 6 - 20 mg/dL   Creatinine, Ser 0.73 0.44 - 1.00 mg/dL   Calcium 8.1 (L) 8.9 - 10.3 mg/dL   GFR calc non Af Amer >60 >60 mL/min   GFR calc Af Amer >60 >60 mL/min   Anion gap 7 5 - 15     Treatments: IV hydration, antibiotics: Ancef, analgesia: toradol and surgery: total laparoscopic hysterectomy and right oophorectomy  Discharge Exam: Blood pressure 108/64, pulse 69, temperature 98.3 F (36.8 C), temperature source Oral, resp. rate 18, height 5\' 6"  (1.676 m), weight 81.2 kg (179 lb), SpO2 100 %. Gen: A&Ox3, NAD CV: RRR, no MRG Resp: CTAB Abdomen: soft, NT, ND +BS Incision: c/d/i, bandage on Ext: No edema, no  calf tenderness bilaterally, SCDs in place     Disposition: Discharge disposition: 01-Home or Self Care        Allergies as of 07/24/2017      Reactions   Lactose Intolerance (gi) Other (See Comments)   ABDOMINAL CRAMPING (MILK)      Medication List    STOP taking these medications   ibuprofen 200 MG tablet Commonly known as:  ADVIL,MOTRIN   naproxen sodium 220 MG tablet Commonly known as:  ALEVE   Norethin Ace-Eth Estrad-FE 1-20 MG-MCG(24) Chew     TAKE these medications   docusate sodium 100 MG capsule Commonly known as:  COLACE Take 1 capsule (100 mg total) by mouth 2 (two) times daily.   loratadine 10 MG tablet Commonly known as:  CLARITIN Take 10 mg by mouth daily.   naproxen 500 MG tablet Commonly known as:  NAPROSYN Take 1 tablet (500 mg total) by mouth 2 (two) times daily with a meal.   oxyCODONE-acetaminophen 5-325 MG tablet Commonly known as:  PERCOCET/ROXICET Take 1 tablet by mouth every 6 (six) hours as needed for up to 7 days for moderate pain ((when tolerating fluids)).   pseudoephedrine 120 MG 12 hr tablet Commonly known as:  SUDAFED Take 120 mg by mouth daily as needed for congestion.      Follow-up Information    Janyth Pupa, DO Follow up in 2 week(s).   Specialty:  Obstetrics and Gynecology Contact information: [address] [place] [address] [phone]           Signed: Annalee Genta 07/24/2017, 1:19 PM

## 2017-10-17 DIAGNOSIS — K589 Irritable bowel syndrome without diarrhea: Secondary | ICD-10-CM | POA: Diagnosis not present

## 2017-10-17 DIAGNOSIS — Z90710 Acquired absence of both cervix and uterus: Secondary | ICD-10-CM | POA: Diagnosis not present

## 2017-10-17 DIAGNOSIS — N951 Menopausal and female climacteric states: Secondary | ICD-10-CM | POA: Diagnosis not present

## 2017-10-17 DIAGNOSIS — N941 Unspecified dyspareunia: Secondary | ICD-10-CM | POA: Diagnosis not present

## 2017-11-13 DIAGNOSIS — R140 Abdominal distension (gaseous): Secondary | ICD-10-CM | POA: Diagnosis not present

## 2017-11-13 DIAGNOSIS — K6289 Other specified diseases of anus and rectum: Secondary | ICD-10-CM | POA: Diagnosis not present

## 2017-11-13 DIAGNOSIS — R1084 Generalized abdominal pain: Secondary | ICD-10-CM | POA: Diagnosis not present

## 2017-11-14 ENCOUNTER — Other Ambulatory Visit: Payer: Self-pay | Admitting: Gastroenterology

## 2017-11-14 DIAGNOSIS — K6289 Other specified diseases of anus and rectum: Secondary | ICD-10-CM

## 2017-11-14 DIAGNOSIS — R140 Abdominal distension (gaseous): Secondary | ICD-10-CM

## 2017-11-14 DIAGNOSIS — R1084 Generalized abdominal pain: Secondary | ICD-10-CM

## 2017-11-20 ENCOUNTER — Ambulatory Visit
Admission: RE | Admit: 2017-11-20 | Discharge: 2017-11-20 | Disposition: A | Discharge status: Home / Self Care | Payer: BLUE CROSS/BLUE SHIELD | Source: Ambulatory Visit | Attending: Gastroenterology | Admitting: Gastroenterology

## 2017-11-20 DIAGNOSIS — K6289 Other specified diseases of anus and rectum: Secondary | ICD-10-CM

## 2017-11-20 DIAGNOSIS — K5900 Constipation, unspecified: Secondary | ICD-10-CM | POA: Diagnosis not present

## 2017-11-20 DIAGNOSIS — R1084 Generalized abdominal pain: Secondary | ICD-10-CM

## 2017-11-20 DIAGNOSIS — R140 Abdominal distension (gaseous): Secondary | ICD-10-CM

## 2017-11-20 IMAGING — CT CT ABD-PELV W/ CM
2 of 5 series · 15 of 46 positions shown, 17 images · IV contrast (iopamidol)
Comparison: None.

CLINICAL DATA: Abdominal bloating and cramping with constipation
several years worse over the past 3 weeks.

EXAM:
CT ABDOMEN AND PELVIS WITH CONTRAST
TECHNIQUE: Multidetector CT imaging of the abdomen and pelvis was performed
using the standard protocol following bolus administration of
intravenous contrast.
CONTRAST:  100mL [LR] IOPAMIDOL ([LR]) INJECTION 61%

[Series 2: abd pelvis 5.00 br40 s3 ax · axial · 0.87mm/px · z∈[+1222,+1622]mm · 12 of 90 slices shown, 14 images]
[im 5/90  soft-tissue]
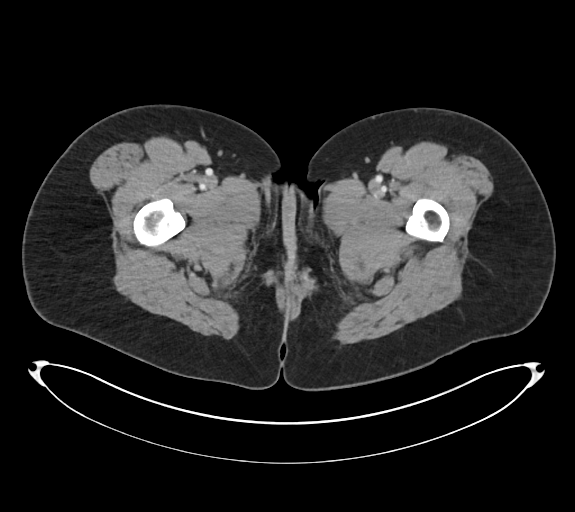
[im 5/90  bone]
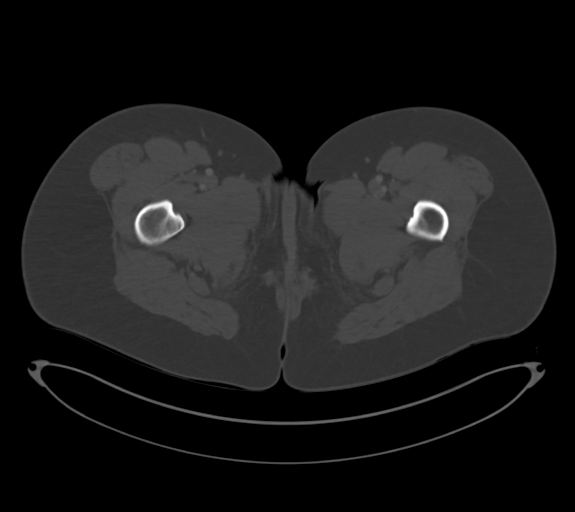
[im 15/90  soft-tissue]
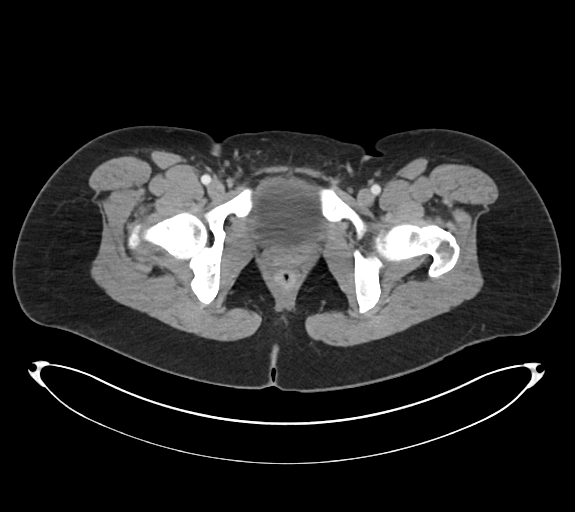
[im 19/90  soft-tissue]
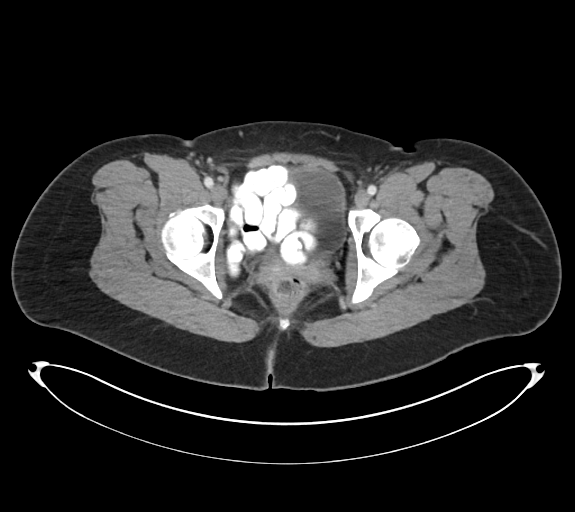
[im 29/90  soft-tissue]
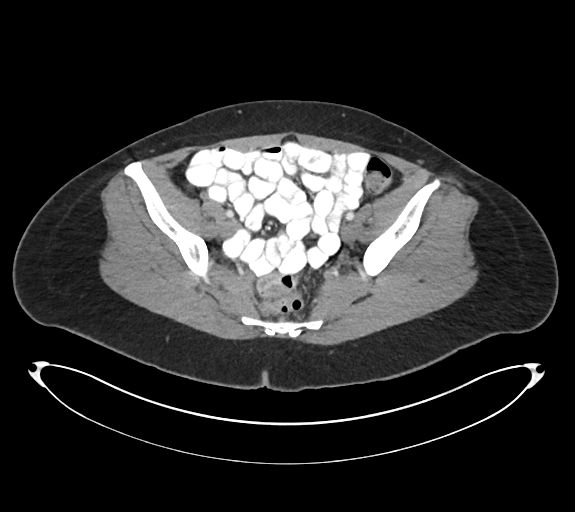
[im 33/90  soft-tissue]
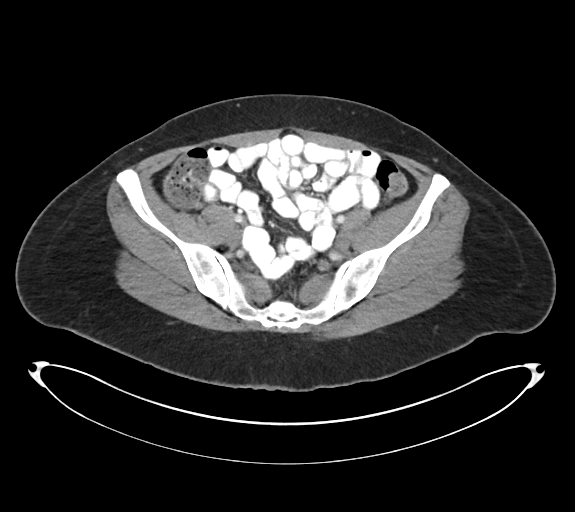
[im 43/90  soft-tissue]
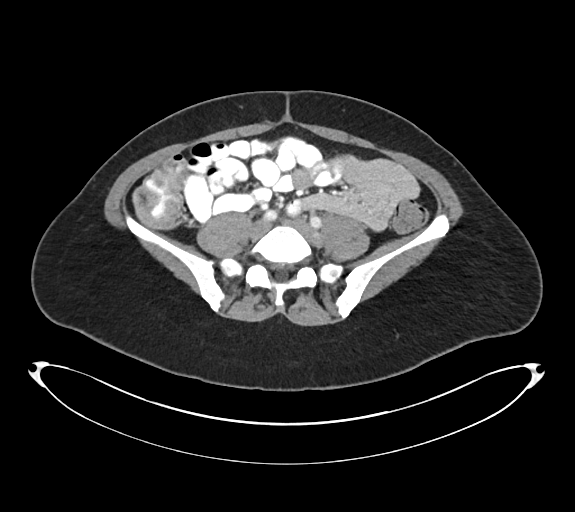
[im 47/90  soft-tissue]
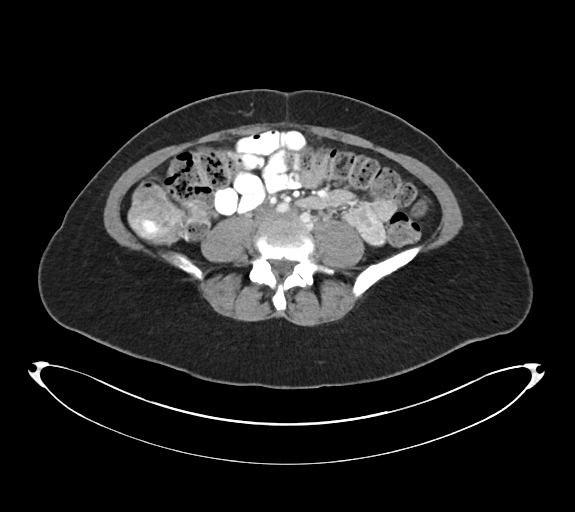
[im 57/90  soft-tissue]
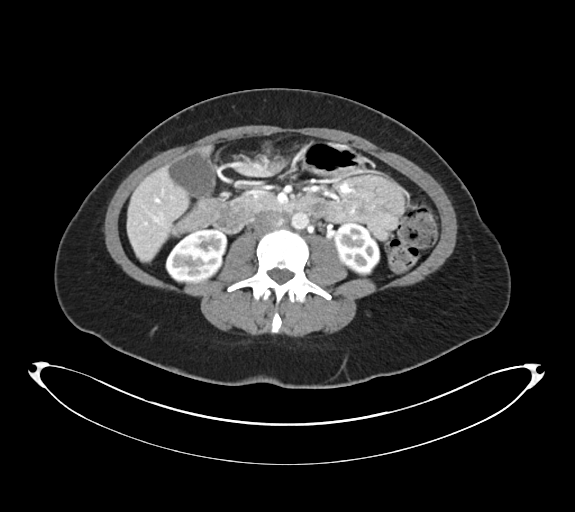
[im 61/90  soft-tissue]
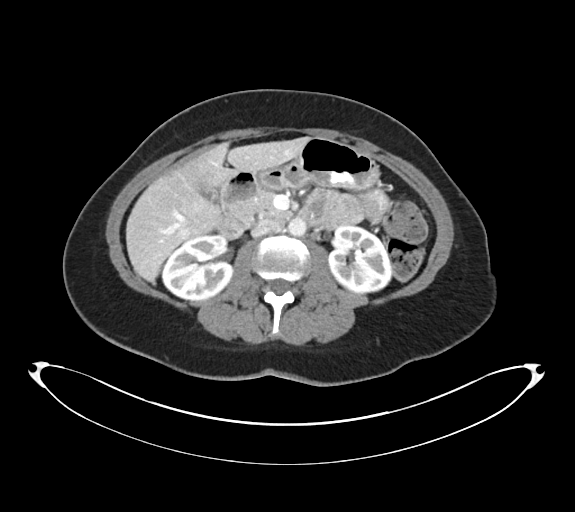
[im 61/90  bone]
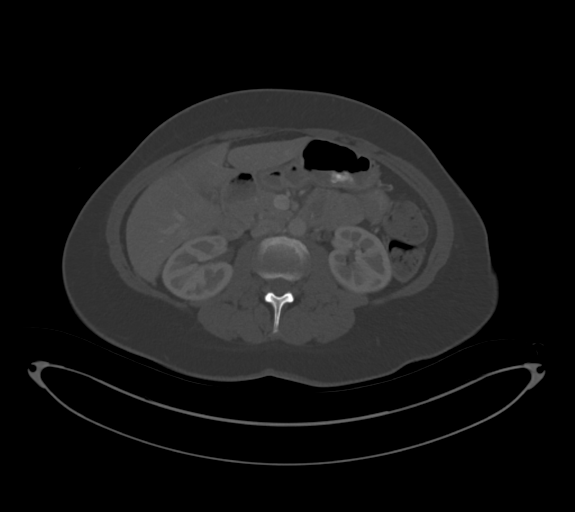
[im 71/90  soft-tissue]
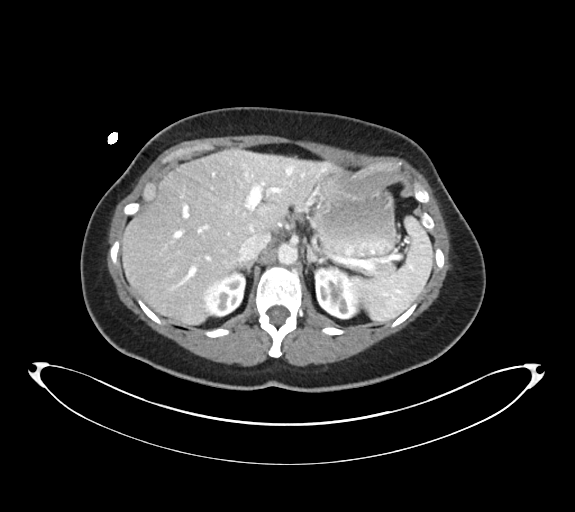
[im 75/90  soft-tissue]
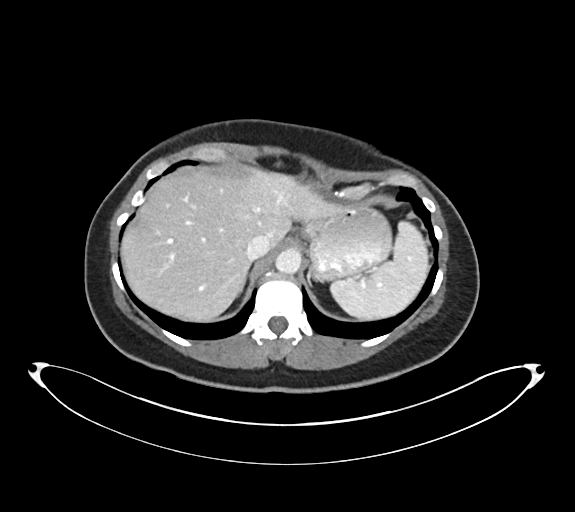
[im 85/90  soft-tissue]
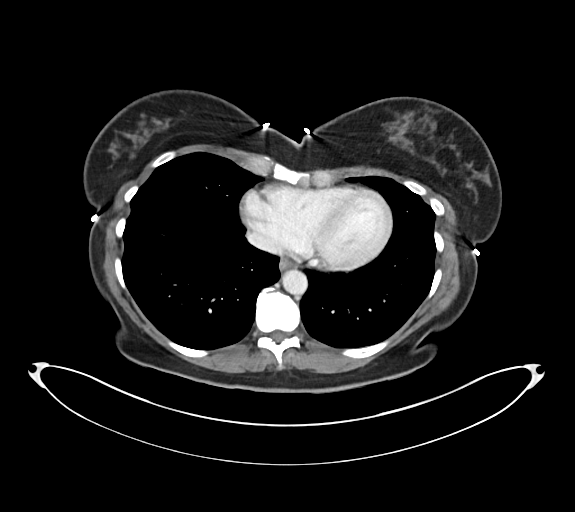

[Series 6: abd pelvis 2.00 br40 s3 cor · coronal · 0.88mm/px · 3 of 137 slices shown]
[im 46/137  soft-tissue]
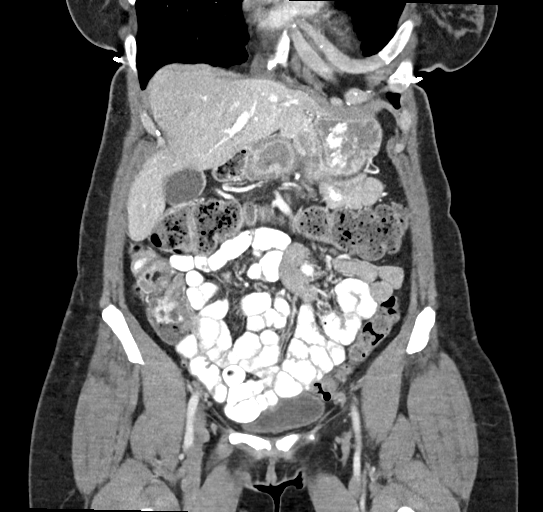
[im 61/137  soft-tissue]
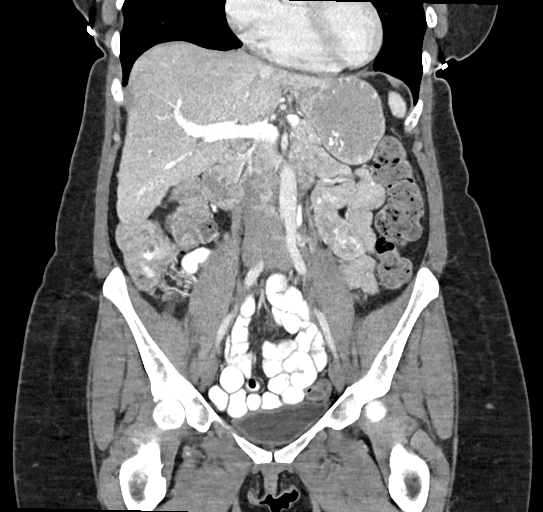
[im 76/137  soft-tissue]
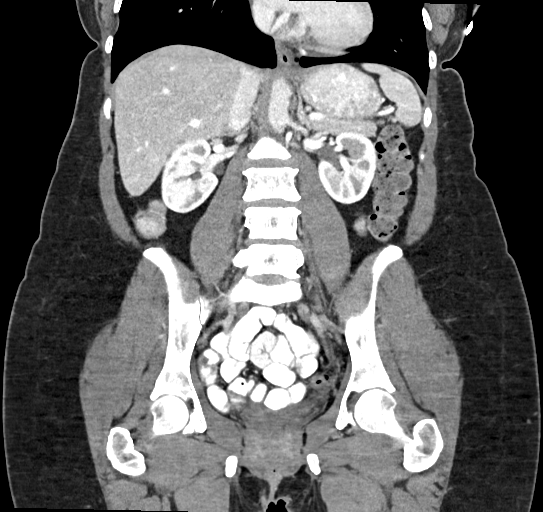

[15 of 46 positions shown; findings below may reference images not displayed]

FINDINGS: Lower chest: Normal.

Hepatobiliary: Liver, gallbladder and biliary tree are normal.

Pancreas: Normal.

Spleen: Normal.

Adrenals/Urinary Tract: Adrenal glands are normal. Kidneys are
normal in size without hydronephrosis or nephrolithiasis. Ureters
and bladder are normal.

Stomach/Bowel: Stomach and small bowel are normal. Appendix is
normal. There is mild fecal retention over the colon worse over the
transverse colon. Colon is otherwise unremarkable.

Vascular/Lymphatic: Normal.

Reproductive: Previous hysterectomy.  Adnexal regions are normal.

Other: No free fluid or focal inflammatory change.

Musculoskeletal: Mild degenerative change of the spine.
IMPRESSION: No acute findings in the abdomen/pelvis.

## 2017-11-20 MED ORDER — IOPAMIDOL (ISOVUE-300) INJECTION 61%
100.0000 mL | Freq: Once | INTRAVENOUS | Status: AC | PRN
  Administered 2017-11-20: 100 mL via INTRAVENOUS

## 2018-01-27 DIAGNOSIS — L720 Epidermal cyst: Secondary | ICD-10-CM | POA: Diagnosis not present

## 2018-07-17 DIAGNOSIS — N951 Menopausal and female climacteric states: Secondary | ICD-10-CM | POA: Diagnosis not present

## 2018-07-17 DIAGNOSIS — K589 Irritable bowel syndrome without diarrhea: Secondary | ICD-10-CM | POA: Diagnosis not present

## 2018-07-20 DIAGNOSIS — Z136 Encounter for screening for cardiovascular disorders: Secondary | ICD-10-CM | POA: Diagnosis not present

## 2018-07-20 DIAGNOSIS — Z5181 Encounter for therapeutic drug level monitoring: Secondary | ICD-10-CM | POA: Diagnosis not present

## 2018-09-09 DIAGNOSIS — Z01411 Encounter for gynecological examination (general) (routine) with abnormal findings: Secondary | ICD-10-CM | POA: Diagnosis not present

## 2018-10-01 DIAGNOSIS — N6009 Solitary cyst of unspecified breast: Secondary | ICD-10-CM | POA: Diagnosis not present

## 2018-10-01 DIAGNOSIS — N6311 Unspecified lump in the right breast, upper outer quadrant: Secondary | ICD-10-CM | POA: Diagnosis not present

## 2018-10-01 DIAGNOSIS — N6313 Unspecified lump in the right breast, lower outer quadrant: Secondary | ICD-10-CM | POA: Diagnosis not present

## 2018-10-16 DIAGNOSIS — Z20828 Contact with and (suspected) exposure to other viral communicable diseases: Secondary | ICD-10-CM | POA: Diagnosis not present

## 2019-03-09 DIAGNOSIS — Z23 Encounter for immunization: Secondary | ICD-10-CM | POA: Diagnosis not present

## 2019-06-17 ENCOUNTER — Ambulatory Visit: Payer: BC Managed Care – PPO | Attending: Family

## 2019-06-17 DIAGNOSIS — Z23 Encounter for immunization: Secondary | ICD-10-CM

## 2019-06-17 NOTE — Progress Notes (Signed)
   Covid-19 Vaccination Clinic  Name:  Ellen Galloway    MRN: [id number] DOB: 1970-07-28  06/17/2019  [name] was observed post Covid-19 immunization for 15 minutes without incident. She was provided with Vaccine Information Sheet and instruction to access the V-Safe system.   [name] was instructed to call 911 with any severe reactions post vaccine: Marland Kitchen Difficulty breathing  . Swelling of face and throat  . A fast heartbeat  . A bad rash all over body  . Dizziness and weakness   Immunizations Administered    Name Date Dose VIS Date Route   Moderna COVID-19 Vaccine 06/17/2019  3:35 PM 0.5 mL 03/02/2019 Intramuscular   Manufacturer: Moderna   Lot: [id number]   [id number]

## 2019-07-20 ENCOUNTER — Ambulatory Visit: Payer: BC Managed Care – PPO | Attending: Family

## 2019-07-20 DIAGNOSIS — Z23 Encounter for immunization: Secondary | ICD-10-CM

## 2019-07-20 NOTE — Progress Notes (Signed)
   Covid-19 Vaccination Clinic  Name:  Ellen Galloway    MRN: [id number] DOB: Feb 11, 1971  07/20/2019  [name] was observed post Covid-19 immunization for 15 minutes without incident. She was provided with Vaccine Information Sheet and instruction to access the V-Safe system.   [name] was instructed to call 911 with any severe reactions post vaccine: Marland Kitchen Difficulty breathing  . Swelling of face and throat  . A fast heartbeat  . A bad rash all over body  . Dizziness and weakness   Immunizations Administered    Name Date Dose VIS Date Route   Moderna COVID-19 Vaccine 07/20/2019 12:56 PM 0.5 mL 03/2019 Intramuscular   Manufacturer: Moderna   Lot: [id number]   [id number]

## 2019-09-09 DIAGNOSIS — Z90710 Acquired absence of both cervix and uterus: Secondary | ICD-10-CM | POA: Diagnosis not present

## 2019-09-09 DIAGNOSIS — Z79890 Hormone replacement therapy (postmenopausal): Secondary | ICD-10-CM | POA: Diagnosis not present

## 2019-09-09 DIAGNOSIS — N951 Menopausal and female climacteric states: Secondary | ICD-10-CM | POA: Diagnosis not present

## 2019-09-09 DIAGNOSIS — Z01411 Encounter for gynecological examination (general) (routine) with abnormal findings: Secondary | ICD-10-CM | POA: Diagnosis not present

## 2019-10-05 DIAGNOSIS — Z1231 Encounter for screening mammogram for malignant neoplasm of breast: Secondary | ICD-10-CM | POA: Diagnosis not present

## 2020-03-09 ENCOUNTER — Ambulatory Visit: Payer: BC Managed Care – PPO | Attending: Internal Medicine

## 2020-03-09 DIAGNOSIS — Z23 Encounter for immunization: Secondary | ICD-10-CM

## 2020-03-09 NOTE — Progress Notes (Signed)
   Covid-19 Vaccination Clinic  Name:  Ellen Galloway    MRN: [id number] DOB: 29-Apr-1970  03/09/2020  [name] was observed post Covid-19 immunization for 15 minutes without incident. She was provided with Vaccine Information Sheet and instruction to access the V-Safe system.   [name] was instructed to call 911 with any severe reactions post vaccine: Marland Kitchen Difficulty breathing  . Swelling of face and throat  . A fast heartbeat  . A bad rash all over body  . Dizziness and weakness   Immunizations Administered    No immunizations on file.

## 2020-07-05 DIAGNOSIS — Z5181 Encounter for therapeutic drug level monitoring: Secondary | ICD-10-CM | POA: Diagnosis not present

## 2020-07-05 DIAGNOSIS — E782 Mixed hyperlipidemia: Secondary | ICD-10-CM | POA: Diagnosis not present

## 2020-07-05 DIAGNOSIS — Z0000 Encounter for general adult medical examination without abnormal findings: Secondary | ICD-10-CM | POA: Diagnosis not present

## 2020-09-06 ENCOUNTER — Encounter: Payer: Self-pay | Admitting: Physician Assistant

## 2020-09-06 ENCOUNTER — Ambulatory Visit (INDEPENDENT_AMBULATORY_CARE_PROVIDER_SITE_OTHER): Payer: BC Managed Care – PPO | Admitting: Physician Assistant

## 2020-09-06 ENCOUNTER — Other Ambulatory Visit: Payer: Self-pay

## 2020-09-06 DIAGNOSIS — L820 Inflamed seborrheic keratosis: Secondary | ICD-10-CM | POA: Diagnosis not present

## 2020-09-06 DIAGNOSIS — Z1283 Encounter for screening for malignant neoplasm of skin: Secondary | ICD-10-CM | POA: Diagnosis not present

## 2020-09-06 DIAGNOSIS — D485 Neoplasm of uncertain behavior of skin: Secondary | ICD-10-CM

## 2020-09-06 DIAGNOSIS — D2239 Melanocytic nevi of other parts of face: Secondary | ICD-10-CM | POA: Diagnosis not present

## 2020-09-06 NOTE — Patient Instructions (Addendum)
Biopsy, Surgery (Curettage) & Surgery (Excision) Aftercare Instructions  1. Okay to remove bandage in 24 hours  2. Wash area with soap and water  3. Apply Vaseline to area twice daily until healed (Not Neosporin)  4. Okay to cover with a Band-Aid to decrease the chance of infection or prevent irritation from clothing; also it's okay to uncover lesion at home.  5. Suture instructions: return to our office in 7-10 or 10-14 days for a nurse visit for suture removal. Variable healing with sutures, if pain or itching occurs call our office. It's okay to shower or bathe 24 hours after sutures are given.  6. The following risks may occur after a biopsy, curettage or excision: bleeding, scarring, discoloration, recurrence, infection (redness, yellow drainage, pain or swelling).  7. For questions, concerns and results call our office at [place] before 4pm & Friday before 3pm. Biopsy results will be available in 1 week.    Mohs Surgery Mohs surgery is a procedure used to treat skin cancer. It is often used to treat common types of skin cancer, such as basal cell carcinoma and squamous cell carcinoma. In this procedure, cancerous skin cells are carefully cut away layer by layer. The goal is to remove all cancerous tissue and leave healthy skin. This reduces scarring and allows for a better cosmetic outcome. Mohs surgery is used to treat skin cancer in areas where it is important to save as much of the normal skin as possible. These areas include the face, nose, ears, lips, and genitals. This procedure may be done if:  Your skin cancer has returned after another type of treatment was done.  The cancer is likely to return.  The cancerous area is large.  The cancerous area has edges that are not clearly defined.  The cancer is growing rapidly. Tell a health care provider about:  Any allergies you have.  All medicines you are taking, including vitamins, herbs, eye drops, creams, and  over-the-counter medicines.  Any problems you or family members have had with anesthetic medicines.  Any blood disorders you have.  Any surgeries you have had.  Any medical conditions you have.  Whether you are pregnant or may be pregnant. What are the risks? Generally, this is a safe procedure. However, problems may occur, including:  Infection.  Bleeding.  Allergic reactions to medicines.  Damage to other structures, such as nerves. What happens before the procedure?  Ask your health care provider about: ? Changing or stopping your regular medicines. This is especially important if you are taking diabetes medicines or blood thinners. ? Taking medicines such as aspirin and ibuprofen. These medicines can thin your blood. Do not take these medicines unless your health care provider tells you to take them. ? Taking over-the-counter medicines, vitamins, herbs, and supplements.  Ask your health care provider how your surgical site will be marked or identified.  Ask your health care provider what steps will be taken to help prevent infection. These may include: ? Removing hair at the surgery site. ? Washing skin with a germ-killing soap. ? Taking antibiotic medicine. What happens during the procedure?  You will be given a medicine to numb the area (local anesthetic).  A layer of cancerous tissue will be removed with a scalpel. The layer removed will contain a small amount of the healthy tissue surrounding the cancerous tissue.  The layer of removed tissue will be checked right away under a microscope. The surgeon will note the exact location of the cancerous cells.  Another layer of tissue may be removed from an area with any remaining cancerous cells. This layer will be checked in the same way.  More layers of cancerous tissue may be removed, one by one, and checked until no signs of cancer remain.  Depending on the size and location of the surgical wound, it may be closed  with stitches (sutures) or left open to heal on its own. In some cases, a skin flap or skin graft may be needed.  A bandage (dressing) will be applied to the area. The procedure may vary among health care providers and hospitals.   What happens after the procedure?  Return to your normal activities as told by your health care provider. Summary  Mohs surgery is a procedure used to treat skin cancer on the face, ears, nose, lips, and genitals. It removes the cancerous cells while leaving as much healthy skin as possible.  Generally, this is a safe procedure. However, problems may occur, including infection, bleeding, and damage to other structures, such as nerves.  Follow your health care provider's instructions before the procedure. You may be asked to change or stop certain medicines.  After the procedure, you may return to your normal activities as told by your health care provider. This information is not intended to replace advice given to you by your health care provider. Make sure you discuss any questions you have with your health care provider. Document Revised: 10/14/2017 Document Reviewed: 10/14/2017 Elsevier Patient Education  [place].

## 2020-09-06 NOTE — Progress Notes (Signed)
   New Patient   Subjective  Ellen Galloway is a 50 y.o. female who presents for the following: Annual Exam (No new concerns- no family or personal history of melanoma or non mole skin cancers.).   The following portions of the chart were reviewed this encounter and updated as appropriate:  Tobacco  Allergies  Meds  Problems  Med Hx  Surg Hx  Fam Hx      Objective  Well appearing patient in no apparent distress; mood and affect are within normal limits.  A full examination was performed including scalp, head, eyes, ears, nose, lips, neck, chest, axillae, abdomen, back, buttocks, bilateral upper extremities, bilateral lower extremities, hands, feet, fingers, toes, fingernails, and toenails. All findings within normal limits unless otherwise noted below.  Objective  Left Alar Crease: Pearly papule with telangectasia.        Objective  Right Buccal Cheek: Erythematous stuck-on, brown plaque.    Assessment & Plan  Neoplasm of uncertain behavior of skin Left Alar Crease  Skin / nail biopsy Type of biopsy: tangential   Informed consent: discussed and consent obtained   Timeout: patient name, date of birth, surgical site, and procedure verified   Procedure prep:  Patient was prepped and draped in usual sterile fashion (Non sterile) Prep type:  Chlorhexidine Anesthesia: the lesion was anesthetized in a standard fashion   Anesthetic:  1% lidocaine w/ epinephrine 1-100,000 local infiltration Instrument used: flexible razor blade   Hemostasis achieved with: aluminum chloride and electrodesiccation   Outcome: patient tolerated procedure well   Post-procedure details: sterile dressing applied and wound care instructions given   Dressing type: bandage and petrolatum    Specimen 1 - Surgical pathology Differential Diagnosis: R/O BCC vs SCC - cautery   Check Margins: No  Inflamed seborrheic keratosis Right Buccal Cheek  Destruction of lesion - Right Buccal  Cheek Complexity: simple   Destruction method: cryotherapy   Informed consent: discussed and consent obtained   Timeout:  patient name, date of birth, surgical site, and procedure verified Lesion destroyed using liquid nitrogen: Yes   Cryotherapy cycles:  3 Outcome: patient tolerated procedure well with no complications   Post-procedure details: wound care instructions given      I, Chyna Kneece, PA-C, have reviewed all documentation for this visit. The documentation on 09/06/20 for the exam, diagnosis, procedures, and orders are all accurate and complete.

## 2020-10-10 DIAGNOSIS — Z1231 Encounter for screening mammogram for malignant neoplasm of breast: Secondary | ICD-10-CM | POA: Diagnosis not present

## 2020-10-17 DIAGNOSIS — R928 Other abnormal and inconclusive findings on diagnostic imaging of breast: Secondary | ICD-10-CM | POA: Diagnosis not present

## 2020-10-17 DIAGNOSIS — N6002 Solitary cyst of left breast: Secondary | ICD-10-CM | POA: Diagnosis not present

## 2021-08-23 DIAGNOSIS — E559 Vitamin D deficiency, unspecified: Secondary | ICD-10-CM | POA: Diagnosis not present

## 2021-08-23 DIAGNOSIS — Z1211 Encounter for screening for malignant neoplasm of colon: Secondary | ICD-10-CM | POA: Diagnosis not present

## 2021-08-23 DIAGNOSIS — E785 Hyperlipidemia, unspecified: Secondary | ICD-10-CM | POA: Diagnosis not present

## 2021-08-23 DIAGNOSIS — Z0000 Encounter for general adult medical examination without abnormal findings: Secondary | ICD-10-CM | POA: Diagnosis not present

## 2021-09-12 ENCOUNTER — Ambulatory Visit: Payer: BC Managed Care – PPO | Admitting: Physician Assistant

## 2021-10-16 DIAGNOSIS — Z1231 Encounter for screening mammogram for malignant neoplasm of breast: Secondary | ICD-10-CM | POA: Diagnosis not present

## 2021-11-08 ENCOUNTER — Ambulatory Visit: Payer: BC Managed Care – PPO | Admitting: Physician Assistant

## 2021-11-24 DIAGNOSIS — Z03818 Encounter for observation for suspected exposure to other biological agents ruled out: Secondary | ICD-10-CM | POA: Diagnosis not present

## 2021-11-24 DIAGNOSIS — R0981 Nasal congestion: Secondary | ICD-10-CM | POA: Diagnosis not present

## 2021-11-24 DIAGNOSIS — J0140 Acute pansinusitis, unspecified: Secondary | ICD-10-CM | POA: Diagnosis not present

## 2021-11-24 DIAGNOSIS — Z6830 Body mass index (BMI) 30.0-30.9, adult: Secondary | ICD-10-CM | POA: Diagnosis not present

## 2022-08-28 DIAGNOSIS — E559 Vitamin D deficiency, unspecified: Secondary | ICD-10-CM | POA: Diagnosis not present

## 2022-08-28 DIAGNOSIS — Z6833 Body mass index (BMI) 33.0-33.9, adult: Secondary | ICD-10-CM | POA: Diagnosis not present

## 2022-08-28 DIAGNOSIS — E785 Hyperlipidemia, unspecified: Secondary | ICD-10-CM | POA: Diagnosis not present

## 2022-08-28 DIAGNOSIS — Z0000 Encounter for general adult medical examination without abnormal findings: Secondary | ICD-10-CM | POA: Diagnosis not present

## 2022-10-22 DIAGNOSIS — Z1231 Encounter for screening mammogram for malignant neoplasm of breast: Secondary | ICD-10-CM | POA: Diagnosis not present

## 2022-11-28 DIAGNOSIS — E559 Vitamin D deficiency, unspecified: Secondary | ICD-10-CM | POA: Diagnosis not present

## 2022-12-05 DIAGNOSIS — K6389 Other specified diseases of intestine: Secondary | ICD-10-CM | POA: Diagnosis not present

## 2022-12-05 DIAGNOSIS — D123 Benign neoplasm of transverse colon: Secondary | ICD-10-CM | POA: Diagnosis not present

## 2022-12-05 DIAGNOSIS — D124 Benign neoplasm of descending colon: Secondary | ICD-10-CM | POA: Diagnosis not present

## 2022-12-05 DIAGNOSIS — K648 Other hemorrhoids: Secondary | ICD-10-CM | POA: Diagnosis not present

## 2022-12-05 DIAGNOSIS — Z1211 Encounter for screening for malignant neoplasm of colon: Secondary | ICD-10-CM | POA: Diagnosis not present

## 2023-06-25 ENCOUNTER — Emergency Department (HOSPITAL_COMMUNITY)

## 2023-06-25 ENCOUNTER — Other Ambulatory Visit: Payer: Self-pay

## 2023-06-25 ENCOUNTER — Encounter (HOSPITAL_BASED_OUTPATIENT_CLINIC_OR_DEPARTMENT_OTHER): Payer: Self-pay

## 2023-06-25 ENCOUNTER — Emergency Department (HOSPITAL_BASED_OUTPATIENT_CLINIC_OR_DEPARTMENT_OTHER)
Admission: EM | Admit: 2023-06-25 | Discharge: 2023-06-25 | Disposition: A | Discharge status: Home / Self Care | Attending: Emergency Medicine | Admitting: Emergency Medicine

## 2023-06-25 ENCOUNTER — Emergency Department (HOSPITAL_BASED_OUTPATIENT_CLINIC_OR_DEPARTMENT_OTHER)

## 2023-06-25 DIAGNOSIS — E041 Nontoxic single thyroid nodule: Secondary | ICD-10-CM | POA: Diagnosis not present

## 2023-06-25 DIAGNOSIS — R29818 Other symptoms and signs involving the nervous system: Secondary | ICD-10-CM | POA: Diagnosis not present

## 2023-06-25 DIAGNOSIS — R29810 Facial weakness: Secondary | ICD-10-CM | POA: Diagnosis not present

## 2023-06-25 DIAGNOSIS — G510 Bell's palsy: Secondary | ICD-10-CM | POA: Diagnosis not present

## 2023-06-25 DIAGNOSIS — R9431 Abnormal electrocardiogram [ECG] [EKG]: Secondary | ICD-10-CM | POA: Diagnosis not present

## 2023-06-25 DIAGNOSIS — R202 Paresthesia of skin: Secondary | ICD-10-CM | POA: Diagnosis not present

## 2023-06-25 DIAGNOSIS — I6522 Occlusion and stenosis of left carotid artery: Secondary | ICD-10-CM | POA: Diagnosis not present

## 2023-06-25 LAB — COMPREHENSIVE METABOLIC PANEL
ALT: 11 U/L (ref 0–44)
AST: 18 U/L (ref 15–41)
Albumin: 4.6 g/dL (ref 3.5–5.0)
Alkaline Phosphatase: 46 U/L (ref 38–126)
Anion gap: 9 (ref 5–15)
BUN: 13 mg/dL (ref 6–20)
CO2: 26 mmol/L (ref 22–32)
Calcium: 9.6 mg/dL (ref 8.9–10.3)
Chloride: 104 mmol/L (ref 98–111)
Creatinine, Ser: 0.77 mg/dL (ref 0.44–1.00)
GFR, Estimated: >60 mL/min (ref >60)
Glucose, Bld: 95 mg/dL (ref 70–99)
Potassium: 4 mmol/L (ref 3.5–5.1)
Sodium: 139 mmol/L (ref 135–145)
Total Bilirubin: 0.5 mg/dL (ref 0.0–1.2)
Total Protein: 7.3 g/dL (ref 6.5–8.1)

## 2023-06-25 LAB — URINALYSIS, ROUTINE W REFLEX MICROSCOPIC
Bilirubin Urine: NEGATIVE
Glucose, UA: NEGATIVE mg/dL
Ketones, ur: NEGATIVE mg/dL
Leukocytes,Ua: NEGATIVE
Nitrite: NEGATIVE
Protein, ur: NEGATIVE mg/dL
Specific Gravity, Urine: 1.011 (ref 1.005–1.030)
pH: 5 (ref 5.0–8.0)

## 2023-06-25 LAB — RAPID URINE DRUG SCREEN, HOSP PERFORMED
Amphetamines: NOT DETECTED
Barbiturates: NOT DETECTED
Benzodiazepines: NOT DETECTED
Cocaine: NOT DETECTED
Opiates: NOT DETECTED
Tetrahydrocannabinol: POSITIVE — AB

## 2023-06-25 LAB — PREGNANCY, URINE: Preg Test, Ur: NEGATIVE

## 2023-06-25 LAB — ETHANOL: Alcohol, Ethyl (B): <10 mg/dL (ref <10)

## 2023-06-25 LAB — CBC
HCT: 41.8 % (ref 36.0–46.0)
Hemoglobin: 14.1 g/dL (ref 12.0–15.0)
MCH: 32.8 pg (ref 26.0–34.0)
MCHC: 33.7 g/dL (ref 30.0–36.0)
MCV: 97.2 fL (ref 80.0–100.0)
Platelets: 241 10*3/uL (ref 150–400)
RBC: 4.3 MIL/uL (ref 3.87–5.11)
RDW: 12.4 % (ref 11.5–15.5)
WBC: 7.5 10*3/uL (ref 4.0–10.5)
nRBC: 0 % (ref 0.0–0.2)

## 2023-06-25 LAB — DIFFERENTIAL
Abs Immature Granulocytes: 0.01 10*3/uL (ref 0.00–0.07)
Basophils Absolute: 0 10*3/uL (ref 0.0–0.1)
Basophils Relative: 0 %
Eosinophils Absolute: 0.1 10*3/uL (ref 0.0–0.5)
Eosinophils Relative: 1 %
Immature Granulocytes: 0 %
Lymphocytes Relative: 24 %
Lymphs Abs: 1.8 10*3/uL (ref 0.7–4.0)
Monocytes Absolute: 0.6 10*3/uL (ref 0.1–1.0)
Monocytes Relative: 8 %
Neutro Abs: 5 10*3/uL (ref 1.7–7.7)
Neutrophils Relative %: 67 %

## 2023-06-25 LAB — APTT: aPTT: 27 s (ref 24–36)

## 2023-06-25 LAB — CBG MONITORING, ED: Glucose-Capillary: 111 mg/dL — ABNORMAL HIGH (ref 70–99)

## 2023-06-25 LAB — PROTIME-INR
INR: 0.9 (ref 0.8–1.2)
Prothrombin Time: 12.3 s (ref 11.4–15.2)

## 2023-06-25 MED ORDER — PREDNISONE 50 MG PO TABS
50.0000 mg | ORAL_TABLET | Freq: Every day | ORAL | 0 refills | Status: AC
Start: 2023-06-25 — End: ?

## 2023-06-25 MED ORDER — IOHEXOL 350 MG/ML SOLN
100.0000 mL | Freq: Once | INTRAVENOUS | Status: AC | PRN
  Administered 2023-06-25: 75 mL via INTRAVENOUS

## 2023-06-25 MED ORDER — VALACYCLOVIR HCL 1 G PO TABS: 1000.0000 mg | ORAL_TABLET | Freq: Three times a day (TID) | ORAL | 0 refills | Status: DC

## 2023-06-25 NOTE — ED Triage Notes (Addendum)
 Patient arrives with complaints of left side facial droop noticed at 0700 (when she woke up) that is now worsening. Patient now reports tingling to face as well.  Excell Seltzer, [place] to triage to assess patient.

## 2023-06-25 NOTE — ED Notes (Signed)
 Patient transported to CT

## 2023-06-25 NOTE — ED Provider Notes (Signed)
 [place] EMERGENCY DEPARTMENT AT [facility] Provider Note   CSN: [id number] Arrival date & time: 06/25/23  1110     History  Chief Complaint  Patient presents with   Facial Droop   Tingling    Ellen Galloway is a 53 y.o. female.  HPI   53 year old female presents emergency department with complaints of feeling as if her left mouth is drooping.  States that she went to bed around 1130 last night and felt fine.  Woke up this morning and saw herself in the mirror and noticed asymmetry prompting visit to the emergency department.  Does report tingling sensation of the corner of her left mouth.  Denies any visual streams, gait abnormality, slurred speech, facial droop, weakness/sensory deficit of the lower extremities.  Denies any facial pain.  Past medical history significant for allergies  Home Medications Prior to Admission medications   Medication Sig Start Date End Date Taking? Authorizing Provider  docusate sodium (COLACE) 100 MG capsule Take 1 capsule (100 mg total) by mouth 2 (two) times daily. Patient not taking: Reported on 09/06/2020 07/24/17   Myna Hidalgo, DO  loratadine (CLARITIN) 10 MG tablet Take 10 mg by mouth daily. Patient not taking: Reported on 09/06/2020    [provider]  naproxen (NAPROSYN) 500 MG tablet Take 1 tablet (500 mg total) by mouth 2 (two) times daily with a meal. Patient not taking: Reported on 09/06/2020 07/24/17   Myna Hidalgo, DO  pseudoephedrine (SUDAFED) 120 MG 12 hr tablet Take 120 mg by mouth daily as needed for congestion.    [provider]      Allergies    Lactose intolerance (gi)    Review of Systems   Review of Systems  All other systems reviewed and are negative.   Physical Exam Updated Vital Signs BP (!) 163/80 (BP Location: Right Arm)   Pulse 66   Temp 98.3 F (36.8 C) (Oral)   Resp 20   Ht 5\' 6"  (1.676 m)   SpO2 99%   BMI 28.89 kg/m  Physical Exam Vitals and nursing note reviewed.   Constitutional:      General: She is not in acute distress.    Appearance: She is well-developed.  HENT:     Head: Normocephalic and atraumatic.  Eyes:     Conjunctiva/sclera: Conjunctivae normal.  Cardiovascular:     Rate and Rhythm: Normal rate and regular rhythm.     Heart sounds: No murmur heard. Pulmonary:     Effort: Pulmonary effort is normal. No respiratory distress.     Breath sounds: Normal breath sounds.  Abdominal:     Palpations: Abdomen is soft.     Tenderness: There is no abdominal tenderness.  Musculoskeletal:        General: No swelling.     Cervical back: Neck supple.  Skin:    General: Skin is warm and dry.     Capillary Refill: Capillary refill takes less than 2 seconds.  Neurological:     Mental Status: She is alert.     Comments: Alert and oriented to self, place, time and event.   Speech is fluent, clear without dysarthria or dysphasia.   Strength 5/5 in upper/lower extremities   Sensation intact in upper/lower extremities   Normal gait.  CN I not tested  CN II not tested CN III, IV, VI PERRLA and EOMs intact bilaterally  CN V Intact sensation to sharp and light touch to the face.  Patient does report tingling type  sensation left commissure CN VII patient with resting face shows left side of mouth slightly drooping when compared to right.  Able to smile with noticed faint asymmetry in heights of commissure left when compared to right.  Raising of eyebrows/forehead musculature are symmetric. CN VIII not tested  CN IX, X no uvula deviation, symmetric rise of soft palate  CN XI 5/5 SCM and trapezius strength bilaterally  CN XII Midline tongue protrusion, symmetric L/R movements   Psychiatric:        Mood and Affect: Mood normal.     ED Results / Procedures / Treatments   Labs (all labs ordered are listed, but only abnormal results are displayed) Labs Reviewed  CBG MONITORING, ED - Abnormal; Notable for the following components:      Result  Value   Glucose-Capillary 111 (*)    All other components within normal limits  ETHANOL  PROTIME-INR  APTT  CBC  DIFFERENTIAL  COMPREHENSIVE METABOLIC PANEL  RAPID URINE DRUG SCREEN, HOSP PERFORMED  URINALYSIS, ROUTINE W REFLEX MICROSCOPIC  PREGNANCY, URINE    EKG None  Radiology No results found.  Procedures Procedures    Medications Ordered in ED Medications - No data to display  ED Course/ Medical Decision Making/ A&P Clinical Course as of 06/25/23 1450  Wed Jun 25, 2023  1420 Consulted neurology Dr. Selina Cooley who recommended ED to ED transfer for MRI.  If negative, can be discharged home with outpatient follow-up with neurology.  If positive, admission for stroke workup.  [CR]    Clinical Course User Index [CR] Peter Garter, PA                                 Medical Decision Making Amount and/or Complexity of Data Reviewed Labs: ordered. Radiology: ordered.  Risk Prescription drug management.   This patient presents to the ED for concern of strokelike symptoms, this involves an extensive number of treatment options, and is a complaint that carries with it a high risk of complications and morbidity.  The differential diagnosis includes CVA, bell's palsy, peripheral neuropathy   Co morbidities that complicate the patient evaluation  See HPI   Additional history obtained:  Additional history obtained from EMR External records from outside source obtained and reviewed including hospital records   Lab Tests:  I Ordered, and personally interpreted labs.  The pertinent results include: No leukocytosis.  No evidence of anemia.  Placed within range.  UDS positive for THC.  No electrolyte abnormalities.  No transaminitis.  No renal dysfunction.  UA with few bacteria, moderate hemoglobin otherwise unremarkable.  Coags negative.  Ethanol negative.   Imaging Studies ordered:  I ordered imaging studies including CT angio head and neck I independently  visualized and interpreted imaging which showed no significant large vessel occlusion.  No other acute abnormality. I agree with the radiologist interpretation   Cardiac Monitoring: / EKG:  The patient was maintained on a cardiac monitor.  I personally viewed and interpreted the cardiac monitored which showed an underlying rhythm of: Sinus rhythm with right bundle branch block.   Consultations Obtained:  See ED course  Problem List / ED Course / Critical interventions / Medication management  Strokelike symptoms Reevaluation of the patient showed that the patient stayed the same I have reviewed the patients home medicines and have made adjustments as needed   Social Determinants of Health:  Denies tobacco illicit drug use.  Test / Admission - Considered:  Strokelike symptoms Vitals signs within normal range and stable throughout visit. Laboratory/imaging studies significant for: see above 53 year old female presents emergency department with complaints of feeling as if her left mouth is drooping.  States that she went to bed around 1130 last night and felt fine.  Woke up this morning and saw herself in the mirror and noticed asymmetry prompting visit to the emergency department.  Does report tingling sensation of the corner of her left mouth.  Denies any visual streams, gait abnormality, slurred speech, facial droop, weakness/sensory deficit of the lower extremities.  Denies any facial pain. On exam, patient with appreciable mild facial asymmetry when smiling as well as rest left compared to right with some "tingling" sensation appreciated in left commissure; otherwise, nonfocal exam.  Workup in the ED send for her overall reassuring.  Labs unremarkable for any acute emergent process.  CTA imaging of patient's head and neck negative for any acute abnormality.  Consulted neurology regarding the patient and recommended ED to ED transfer for MRI brain for stroke rule out.  If negative,  outpatient follow-up with neurology recommended.  If positive, admission.  Treatment plan discussed with patient and she acknowledged understanding.  She elected to travel POV to [facility] emergency department.  Patient stable upon transfer.          Final Clinical Impression(s) / ED Diagnoses Final diagnoses:  None    Rx / DC Orders ED Discharge Orders     None         Peter Garter, [place] 06/25/23 1454    Franne Forts, DO 06/25/23 1456

## 2023-06-25 NOTE — ED Provider Notes (Signed)
 Pt transferred from [facility] to obtain a MRI to r/o CVA.    MRI reviewed by me.  I agree with the radiologist.  MRI: Unremarkable appearance of the brain for age.   Pt likely has Bell's Palsy.  She will be d/c with valtrex and prednisone.  She is to return if worse.  F/u with neuro and with pcp.   Jacalyn Lefevre, MD 06/25/23 [phone]

## 2023-06-26 DIAGNOSIS — I4510 Unspecified right bundle-branch block: Secondary | ICD-10-CM | POA: Diagnosis not present

## 2023-06-26 DIAGNOSIS — G510 Bell's palsy: Secondary | ICD-10-CM | POA: Diagnosis not present

## 2023-06-26 DIAGNOSIS — R21 Rash and other nonspecific skin eruption: Secondary | ICD-10-CM | POA: Diagnosis not present

## 2023-06-26 DIAGNOSIS — I6522 Occlusion and stenosis of left carotid artery: Secondary | ICD-10-CM | POA: Diagnosis not present

## 2023-09-02 DIAGNOSIS — E559 Vitamin D deficiency, unspecified: Secondary | ICD-10-CM | POA: Diagnosis not present

## 2023-09-02 DIAGNOSIS — Z0000 Encounter for general adult medical examination without abnormal findings: Secondary | ICD-10-CM | POA: Diagnosis not present

## 2023-09-02 DIAGNOSIS — E785 Hyperlipidemia, unspecified: Secondary | ICD-10-CM | POA: Diagnosis not present

## 2023-09-03 ENCOUNTER — Other Ambulatory Visit (HOSPITAL_BASED_OUTPATIENT_CLINIC_OR_DEPARTMENT_OTHER): Payer: Self-pay | Admitting: Family Medicine

## 2023-09-03 DIAGNOSIS — E785 Hyperlipidemia, unspecified: Secondary | ICD-10-CM

## 2023-09-17 ENCOUNTER — Ambulatory Visit (INDEPENDENT_AMBULATORY_CARE_PROVIDER_SITE_OTHER): Admitting: Neurology

## 2023-09-17 ENCOUNTER — Encounter: Payer: Self-pay | Admitting: Neurology

## 2023-09-17 VITALS — BP 147/88 | HR 63 | Ht 65.0 in | Wt 208.5 lb

## 2023-09-17 DIAGNOSIS — G510 Bell's palsy: Secondary | ICD-10-CM | POA: Diagnosis not present

## 2023-09-17 NOTE — Patient Instructions (Addendum)
 Continue current medications  Continue to follow up with PCP  Return as needed

## 2023-09-17 NOTE — Progress Notes (Signed)
 [facility]  PATIENT: Ellen Galloway DOB: 28-Aug-1970  REQUESTING CLINICIAN: [name], MD HISTORY FROM: Patient  REASON FOR VISIT: Bell's Palsy    HISTORICAL  CHIEF COMPLAINT:  Chief Complaint  Patient presents with   New Patient (Initial Visit)    Rm12, alone, internal referral for Bell's Palsy: left sided face paralysis     HISTORY OF PRESENT ILLNESS:  This is a 53 year old woman with no reported past medical history who is presenting after being diagnosed with Bell's palsy in March.  Patient reports she woke up in the morning and felt that her left side of her mouth was droopy.  She contacted her PCP who directed her to urgent care.  At urgent care she was recommended MRI done at the ED.  Her MRI was negative for any abnormality, no stroke and she was diagnosed with Bell's palsy and started with prednisone .  Patient reports she completed the prednisone  course, her symptoms improved and she is almost back to normal.  She denies any previous history of Bell's palsy.  OTHER MEDICAL CONDITIONS: None reported    REVIEW OF SYSTEMS: Full 14 system review of systems performed and negative with exception of: As noted in the HPI   ALLERGIES: Allergies  Allergen Reactions   Lactose Intolerance (Gi) Other (See Comments)    ABDOMINAL CRAMPING (MILK)   Prednisone      Gi upset     HOME MEDICATIONS: Outpatient Medications Prior to Visit  Medication Sig Dispense Refill   diphenhydrAMINE (BENADRYL) 25 mg capsule Take 25 mg by mouth at bedtime as needed.     estradiol  (VIVELLE -DOT) 0.0375 MG/24HR Place 1 patch onto the skin 2 (two) times a week.     loratadine  (CLARITIN ) 10 MG tablet Take 10 mg by mouth daily.     pseudoephedrine (SUDAFED) 120 MG 12 hr tablet Take 120 mg by mouth daily as needed for congestion.     valACYclovir  (VALTREX ) 1000 MG tablet Take 1 tablet (1,000 mg total) by mouth 3 (three) times daily. 21 tablet 0   estradiol  (VIVELLE -DOT) 0.025  MG/24HR Place 1 patch onto the skin 2 (two) times a week.     predniSONE  (DELTASONE ) 50 MG tablet Take 1 tablet (50 mg total) by mouth daily with breakfast. 5 tablet 0   No facility-administered medications prior to visit.    PAST MEDICAL HISTORY: Past Medical History:  Diagnosis Date   Seasonal allergies    Varicose veins of bilateral lower extremities with pain     PAST SURGICAL HISTORY: Past Surgical History:  Procedure Laterality Date   BILATERAL SALPINGECTOMY N/A 07/20/2014   Procedure: BILATERAL SALPINGECTOMY;  Surgeon: Keene Pastures, DO;  Location: [facility];  Service: Gynecology;  Laterality: N/A;   DILATION AND CURETTAGE OF UTERUS N/A 07/20/2014   Procedure: DILATATION AND CURETTAGE;  Surgeon: Jennifer Ozan, DO;  Location: [facility];  Service: Gynecology;  Laterality: N/A;   HYSTEROSCOPY N/A 07/20/2014   Procedure: HYSTEROSCOPY WITH HYDROTHERMAL ABLATION;  Surgeon: Keene Pastures, DO;  Location: [facility];  Service: Gynecology;  Laterality: N/A;   LAPAROSCOPIC HYSTERECTOMY N/A 07/23/2017   Procedure: HYSTERECTOMY TOTAL LAPAROSCOPIC, Right Oophrectomy;  Surgeon: Keene Pastures, DO;  Location: [facility];  Service: Gynecology;  Laterality: N/A;   LAPAROSCOPY N/A 07/20/2014   Procedure: LAPAROSCOPY OPERATIVE;  Surgeon: Keene Pastures, DO;  Location: [facility];  Service: Gynecology;  Laterality: N/A;   OOPHORECTOMY Left 07/20/2014   Procedure: OOPHORECTOMY;  Surgeon: Keene Pastures, DO;  Location: [facility];  Service: Gynecology;  Laterality: Left;   WISDOM TOOTH EXTRACTION      FAMILY HISTORY: History reviewed. No pertinent family history.  SOCIAL HISTORY: Social History   Socioeconomic History   Marital status: Married    Spouse name: Not on file   Number of children: Not on file   Years of education: Not on file   Highest education level: Not on file  Occupational History   Not on file  Tobacco Use   Smoking status: Never   Smokeless tobacco: Never  Vaping Use   Vaping status: Never Used   Substance and Sexual Activity   Alcohol use: Yes    Alcohol/week: 7.0 standard drinks of alcohol    Types: 7 Cans of beer per week   Drug use: Yes    Types: Marijuana    Comment: Last use on Tuesday, 07/22/17   Sexual activity: Yes    Birth control/protection: Surgical  Other Topics Concern   Not on file  Social History Narrative   Not on file   Social Drivers of Health   Financial Resource Strain: Not on file  Food Insecurity: Not on file  Transportation Needs: Not on file  Physical Activity: Not on file  Stress: Not on file  Social Connections: Not on file  Intimate Partner Violence: Not on file    PHYSICAL EXAM  GENERAL EXAM/CONSTITUTIONAL: Vitals:  Vitals:   09/17/23 1302 09/17/23 1307  BP: (!) 141/87 (!) 147/88  Pulse:  63  Weight:  208 lb 8 oz (94.6 kg)  Height:  5' 5 (1.651 m)   Body mass index is 34.7 kg/m. Wt Readings from Last 3 Encounters:  09/17/23 208 lb 8 oz (94.6 kg)  07/23/17 179 lb (81.2 kg)  07/16/17 179 lb 8 oz (81.4 kg)   Patient is in no distress; well developed, nourished and groomed; neck is supple  MUSCULOSKELETAL: Gait, strength, tone, movements noted in Neurologic exam below  NEUROLOGIC: MENTAL STATUS:      No data to display         awake, alert, oriented to person, place and time recent and remote memory intact normal attention and concentration language fluent, comprehension intact, naming intact fund of knowledge appropriate  CRANIAL NERVE:  2nd, 3rd, 4th, 6th - Visual fields full to confrontation, extraocular muscles intact, no nystagmus 5th - facial sensation symmetric 7th - facial strength symmetric 8th - hearing intact 9th - palate elevates symmetrically, uvula midline 11th - shoulder shrug symmetric 12th - tongue protrusion midline  MOTOR:  normal bulk and tone, full strength in the BUE, BLE  SENSORY:  normal and symmetric to light touch  COORDINATION:  finger-nose-finger, fine finger movements  normal  GAIT/STATION:  normal     DIAGNOSTIC DATA (LABS, IMAGING, TESTING) - I reviewed patient records, labs, notes, testing and imaging myself where available.  Lab Results  Component Value Date   WBC 7.5 06/25/2023   HGB 14.1 06/25/2023   HCT 41.8 06/25/2023   MCV 97.2 06/25/2023   PLT 241 06/25/2023      Component Value Date/Time   NA 139 06/25/2023 1120   K 4.0 06/25/2023 1120   CL 104 06/25/2023 1120   CO2 26 06/25/2023 1120   GLUCOSE 95 06/25/2023 1120   BUN 13 06/25/2023 1120   CREATININE 0.77 06/25/2023 1120   CALCIUM 9.6 06/25/2023 1120   PROT 7.3 06/25/2023 1120   ALBUMIN 4.6 06/25/2023 1120   AST 18 06/25/2023 1120   ALT 11 06/25/2023 1120   ALKPHOS 46  06/25/2023 1120   BILITOT 0.5 06/25/2023 1120   GFRNONAA >60 06/25/2023 1120   GFRAA >60 07/24/2017 0540   No results found for: CHOL, HDL, LDLCALC, LDLDIRECT, TRIG, CHOLHDL No results found for: WUJW1X No results found for: VITAMINB12 No results found for: TSH  MRI Brain 06/25/2023 Unremarkable appearance of the brain for age     ASSESSMENT AND PLAN  54 y.o. year old female with no reported past medical history who is presenting for follow-up after being diagnosed with left Bell's palsy in March.  She is almost back to normal with no deficit.  She has already completed her prednisone  course.  Plan for patient is to continue her physical therapy, facial exercises, continue follow-up PCP and return as needed.   1. Bell's palsy      Patient Instructions  Continue current medications  Continue to follow up with PCP  Return as needed   No orders of the defined types were placed in this encounter.   No orders of the defined types were placed in this encounter.   Return if symptoms worsen or fail to improve.    Cassandra Cleveland, MD 09/17/2023, 1:34 PM  [facility] [address], [address] [place], [place] [address] [phone]

## 2023-09-24 ENCOUNTER — Other Ambulatory Visit (HOSPITAL_BASED_OUTPATIENT_CLINIC_OR_DEPARTMENT_OTHER)

## 2023-10-02 ENCOUNTER — Ambulatory Visit (HOSPITAL_BASED_OUTPATIENT_CLINIC_OR_DEPARTMENT_OTHER)
Admission: RE | Admit: 2023-10-02 | Discharge: 2023-10-02 | Disposition: A | Discharge status: Home / Self Care | Payer: Self-pay | Source: Ambulatory Visit | Attending: Family Medicine | Admitting: Family Medicine

## 2023-10-02 DIAGNOSIS — E785 Hyperlipidemia, unspecified: Secondary | ICD-10-CM | POA: Insufficient documentation

## 2023-10-28 DIAGNOSIS — Z1231 Encounter for screening mammogram for malignant neoplasm of breast: Secondary | ICD-10-CM | POA: Diagnosis not present

## 2023-11-18 DIAGNOSIS — R928 Other abnormal and inconclusive findings on diagnostic imaging of breast: Secondary | ICD-10-CM | POA: Diagnosis not present

## 2023-12-12 ENCOUNTER — Ambulatory Visit: Attending: Cardiology | Admitting: Cardiology

## 2023-12-12 ENCOUNTER — Encounter: Payer: Self-pay | Admitting: Cardiology

## 2023-12-12 VITALS — BP 129/61 | HR 54 | Resp 16 | Ht 65.0 in | Wt 208.4 lb

## 2023-12-12 DIAGNOSIS — I4510 Unspecified right bundle-branch block: Secondary | ICD-10-CM

## 2023-12-12 NOTE — Patient Instructions (Signed)
 Medication Instructions:  Your physician recommends that you continue on your current medications as directed. Please refer to the Current Medication list given to you today.  *If you need a refill on your cardiac medications before your next appointment, please call your pharmacy*  Lab Work: If you have labs (blood work) drawn today and your tests are completely normal, you will receive your results only by: MyChart Message (if you have MyChart) OR A paper copy in the mail If you have any lab test that is abnormal or we need to change your treatment, we will call you to review the results.  Testing/Procedures: None ordered today.  Follow-Up: At [facility], you and your health needs are our priority.  As part of our continuing mission to provide you with exceptional heart care, our providers are all part of one team.  This team includes your primary Cardiologist (physician) and Advanced Practice Providers or APPs (Physician Assistants and Nurse Practitioners) who all work together to provide you with the care you need, when you need it.  Your next appointment:   As needed  Provider:   Dr. Michele  We recommend signing up for the patient portal called MyChart.  Sign up information is provided on this After Visit Summary.  MyChart is used to connect with patients for Virtual Visits (Telemedicine).  Patients are able to view lab/test results, encounter notes, upcoming appointments, etc.  Non-urgent messages can be sent to your provider as well.   To learn more about what you can do with MyChart, go to [email or web address].   Other Instructions

## 2023-12-12 NOTE — Progress Notes (Signed)
 Cardiology Office Note:    Date:  12/12/2023  NAME:  Ellen Galloway    MRN: [id number] DOB:  04-13-1970   PCP:  Dyane Anthony RAMAN, FNP  Former Cardiology Providers: None Primary Cardiologist:  None, [facility]ACC (established care 12/12/2023) Electrophysiologist:  None   Referring MD: Dyane Anthony RAMAN, FNP  Reason of Consult: Abnormal EKG/right bundle branch block  Chief Complaint  Patient presents with   RBBB   New Patient (Initial Visit)    History of Present Illness:    Ellen Galloway is a 53 y.o. Caucasian female whose past medical history and cardiovascular risk factors includes: Bells Palsy, RBBB. She is being seen today for the evaluation of abnormal EKG/right bundle branch block at the request of Dyane Anthony RAMAN, FNP.  Based on the referral notes from [facility] physicians dated June 26, 2023 patient had gone to the ED for strokelike symptoms and was diagnosed with Bell's palsy.  During that visit an EKG was performed which noted right bundle branch block.  There are no prior EKGs available for comparison.  At follow-up with PCP she was referred to cardiology for further evaluation and management.  In the interim patient had a coronary calcium score in July 2025 and was noted to have a total CAC of 0.  Denies anginal chest pain or heart failure symptoms. Overall functional capacity remains stable. Regular exercise - walks, indoor bike, twice a month goes to [place] for cycling average 20 miles in one setting.   During her recent workup in March 2025 she had a CTA head and neck which reported mild atherosclerosis in the left carotid bifurcation without significant stenosis.  No first degree relatives with premature coronary disease or sudden cardiac death. States that her mother was born with pulmonary artery aneurysm (no intervention, per patient).    Current Medications: Current Meds  Medication Sig   diphenhydrAMINE (BENADRYL) 25 mg capsule Take 25 mg by mouth at bedtime as needed.    estradiol  (VIVELLE -DOT) 0.0375 MG/24HR Place 1 patch onto the skin 2 (two) times a week.   loratadine  (CLARITIN ) 10 MG tablet Take 10 mg by mouth daily.   pseudoephedrine (SUDAFED) 120 MG 12 hr tablet Take 120 mg by mouth daily as needed for congestion.     Allergies:    Lactose intolerance (gi) and Prednisone    Past Medical History: Past Medical History:  Diagnosis Date   Atherosclerosis of left carotid artery    Bell's palsy    Endometriosis    Insomnia    Morbid obesity (HCC)    RBBB    Seasonal allergies    Varicose veins of bilateral lower extremities with pain     Past Surgical History: Past Surgical History:  Procedure Laterality Date   BILATERAL SALPINGECTOMY N/A 07/20/2014   Procedure: BILATERAL SALPINGECTOMY;  Surgeon: Delon Prude, DO;  Location: [facility];  Service: Gynecology;  Laterality: N/A;   DILATION AND CURETTAGE OF UTERUS N/A 07/20/2014   Procedure: DILATATION AND CURETTAGE;  Surgeon: Jennifer Ozan, DO;  Location: [facility];  Service: Gynecology;  Laterality: N/A;   HYSTEROSCOPY N/A 07/20/2014   Procedure: HYSTEROSCOPY WITH HYDROTHERMAL ABLATION;  Surgeon: Delon Prude, DO;  Location: [facility];  Service: Gynecology;  Laterality: N/A;   LAPAROSCOPIC HYSTERECTOMY N/A 07/23/2017   Procedure: HYSTERECTOMY TOTAL LAPAROSCOPIC, Right Oophrectomy;  Surgeon: Prude Delon, DO;  Location: [facility];  Service: Gynecology;  Laterality: N/A;   LAPAROSCOPY N/A 07/20/2014   Procedure: LAPAROSCOPY OPERATIVE;  Surgeon: Delon Prude, DO;  Location: [facility];  Service: Gynecology;  Laterality: N/A;   OOPHORECTOMY Left 07/20/2014   Procedure: OOPHORECTOMY;  Surgeon: Delon Prude, DO;  Location: [facility];  Service: Gynecology;  Laterality: Left;   WISDOM TOOTH EXTRACTION      Social History: Social History   Tobacco Use   Smoking status: Never   Smokeless tobacco: Never  Vaping Use   Vaping status: Never Used  Substance Use Topics   Alcohol use: Yes    Alcohol/week: 7.0 standard  drinks of alcohol    Types: 7 Cans of beer per week   Drug use: Yes    Types: Marijuana    Comment: Last use on Tuesday, 07/22/17    Family History: History reviewed. No pertinent family history.  ROS:   Review of Systems  Cardiovascular:  Negative for chest pain, claudication, irregular heartbeat, leg swelling, near-syncope, orthopnea, palpitations, paroxysmal nocturnal dyspnea and syncope.  Respiratory:  Negative for shortness of breath.   Hematologic/Lymphatic: Negative for bleeding problem.    EKGs/Labs/Other Studies Reviewed:   EKG: EKG Interpretation Date/Time:  Friday December 12 2023 09:44:21 EDT Ventricular Rate:  58 PR Interval:  164 QRS Duration:  142 QT Interval:  442 QTC Calculation: 433 R Axis:   62  Text Interpretation: Sinus bradycardia Right bundle branch block When compared with ECG of 25-Jun-2023 13:08, No significant change since last tracing Confirmed by Michele Richardson [phone]) on 12/12/2023 9:55:59 AM  Echocardiogram: None  Coronary calcium score 0703/2025  Coronary calcium score of 0. This was 1st percentile for age-, race-, and sex-matched controls.  Labs:    Latest Ref Rng & Units 06/25/2023   11:20 AM 07/24/2017    5:40 AM 07/16/2017   12:30 PM  CBC  WBC 4.0 - 10.5 K/uL 7.5  16.7  8.6   Hemoglobin 12.0 - 15.0 g/dL 85.8  89.2  86.8   Hematocrit 36.0 - 46.0 % 41.8  31.5  38.0   Platelets 150 - 400 K/uL 241  191  225        Latest Ref Rng & Units 06/25/2023   11:20 AM 07/24/2017    5:40 AM 07/16/2017   12:30 PM  BMP  Glucose 70 - 99 mg/dL 95  864  86   BUN 6 - 20 mg/dL 13  7  11    Creatinine 0.44 - 1.00 mg/dL 9.22  9.26  9.28   Sodium 135 - 145 mmol/L 139  136  136   Potassium 3.5 - 5.1 mmol/L 4.0  3.6  3.8   Chloride 98 - 111 mmol/L 104  108  105   CO2 22 - 32 mmol/L 26  21  21    Calcium 8.9 - 10.3 mg/dL 9.6  8.1  9.1       Latest Ref Rng & Units 06/25/2023   11:20 AM 07/24/2017    5:40 AM 07/16/2017   12:30 PM  CMP  Glucose 70 - 99  mg/dL 95  864  86   BUN 6 - 20 mg/dL 13  7  11    Creatinine 0.44 - 1.00 mg/dL 9.22  9.26  9.28   Sodium 135 - 145 mmol/L 139  136  136   Potassium 3.5 - 5.1 mmol/L 4.0  3.6  3.8   Chloride 98 - 111 mmol/L 104  108  105   CO2 22 - 32 mmol/L 26  21  21    Calcium 8.9 - 10.3 mg/dL 9.6  8.1  9.1   Total Protein 6.5 - 8.1 g/dL  7.3   6.9   Total Bilirubin 0.0 - 1.2 mg/dL 0.5   0.3   Alkaline Phos 38 - 126 U/L 46   31   AST 15 - 41 U/L 18   19   ALT 0 - 44 U/L 11   17     No results found for: CHOL, HDL, LDLCALC, LDLDIRECT, TRIG, CHOLHDL No results for input(s): LIPOA in the last 8760 hours. No components found for: NTPROBNP No results for input(s): PROBNP in the last 8760 hours. No results for input(s): TSH in the last 8760 hours.  Physical Exam:    Today's Vitals   12/12/23 0941  BP: 129/61  Pulse: (!) 54  Resp: 16  SpO2: 96%  Height: 5' 5 (1.651 m)   Body mass index is 34.7 kg/m. Wt Readings from Last 3 Encounters:  09/17/23 208 lb 8 oz (94.6 kg)  07/23/17 179 lb (81.2 kg)  07/16/17 179 lb 8 oz (81.4 kg)    Physical Exam  Constitutional: No distress.  hemodynamically stable  Neck: No JVD present.  Cardiovascular: Normal rate, regular rhythm, S1 normal and S2 normal. Exam reveals no gallop, no S3 and no S4.  No murmur heard. Pulmonary/Chest: Effort normal and breath sounds normal. No stridor. She has no wheezes. She has no rales.  Musculoskeletal:        General: No edema.     Cervical back: Neck supple.  Skin: Skin is warm.   Impression & Recommendation(s):  Impression:   ICD-10-CM   1. RBBB (right bundle branch block)  I45.10 EKG 12-Lead       Recommendation(s):  RBBB (right bundle branch block) Referred to the practice as she was noted to have a right bundle branch block on surface ECG. Denies anginal chest pain or heart failure symptoms. Educated her with regards to the pathophysiology of right bundle branch block. No additional testing  is warranted at this time for RBBB. Total coronary calcium score 0. Reassurance provided.   Orders Placed:  Orders Placed This Encounter  Procedures   EKG 12-Lead     Final Medication List:   No orders of the defined types were placed in this encounter.   Medications Discontinued During This Encounter  Medication Reason   valACYclovir  (VALTREX ) 1000 MG tablet Patient Preference     Current Outpatient Medications:    diphenhydrAMINE (BENADRYL) 25 mg capsule, Take 25 mg by mouth at bedtime as needed., Disp: , Rfl:    estradiol  (VIVELLE -DOT) 0.0375 MG/24HR, Place 1 patch onto the skin 2 (two) times a week., Disp: , Rfl:    loratadine  (CLARITIN ) 10 MG tablet, Take 10 mg by mouth daily., Disp: , Rfl:    pseudoephedrine (SUDAFED) 120 MG 12 hr tablet, Take 120 mg by mouth daily as needed for congestion., Disp: , Rfl:   Consent:   NA  Disposition:   As needed.  Patient may be asked to follow-up sooner based on the results of the above-mentioned testing.  Her questions and concerns were addressed to her satisfaction. She voices understanding of the recommendations provided during this encounter.    Signed, Madonna Michele HAS, [facility]  A Division of [facility] [address], [place], [place] [address]  12/12/2023 7:08 PM

## 2023-12-23 DIAGNOSIS — H524 Presbyopia: Secondary | ICD-10-CM | POA: Diagnosis not present

## 2023-12-23 DIAGNOSIS — H53002 Unspecified amblyopia, left eye: Secondary | ICD-10-CM | POA: Diagnosis not present
